# Patient Record
Sex: Male | Born: 1945 | Race: White | Hispanic: No | Marital: Married | Smoking: Never smoker
Health system: Southern US, Community
[De-identification: ages and names within clinical notes are randomized; demographics above are authoritative.]

## PROBLEM LIST (undated history)

## (undated) DIAGNOSIS — I10 Essential (primary) hypertension: Secondary | ICD-10-CM

## (undated) DIAGNOSIS — Z87442 Personal history of urinary calculi: Secondary | ICD-10-CM

## (undated) DIAGNOSIS — N189 Chronic kidney disease, unspecified: Secondary | ICD-10-CM

## (undated) DIAGNOSIS — G4730 Sleep apnea, unspecified: Secondary | ICD-10-CM

## (undated) DIAGNOSIS — M1990 Unspecified osteoarthritis, unspecified site: Secondary | ICD-10-CM

## (undated) DIAGNOSIS — M109 Gout, unspecified: Secondary | ICD-10-CM

## (undated) HISTORY — PX: COLONOSCOPY: SHX174

## (undated) HISTORY — PX: TONSILLECTOMY: SUR1361

## (undated) HISTORY — PX: VASECTOMY: SHX75

## (undated) HISTORY — DX: Unspecified osteoarthritis, unspecified site: M19.90

---

## 2005-07-09 ENCOUNTER — Ambulatory Visit: Payer: Self-pay | Admitting: Otolaryngology

## 2007-09-12 ENCOUNTER — Ambulatory Visit: Payer: Self-pay | Admitting: Gastroenterology

## 2014-08-21 ENCOUNTER — Ambulatory Visit: Payer: Self-pay | Admitting: Emergency Medicine

## 2014-08-21 LAB — CBC WITH DIFFERENTIAL/PLATELET
Basophil #: 0 10*3/uL (ref 0.0–0.1)
Basophil %: 0.3 %
Eosinophil #: 0.1 10*3/uL (ref 0.0–0.7)
Eosinophil %: 1 %
HCT: 43.6 % (ref 40.0–52.0)
HGB: 14.5 g/dL (ref 13.0–18.0)
Lymphocyte #: 1.4 10*3/uL (ref 1.0–3.6)
Lymphocyte %: 22.8 %
MCH: 29.5 pg (ref 26.0–34.0)
MCHC: 33.3 g/dL (ref 32.0–36.0)
MCV: 89 fL (ref 80–100)
Monocyte #: 0.4 x10 3/mm (ref 0.2–1.0)
Monocyte %: 6.4 %
Neutrophil #: 4.4 10*3/uL (ref 1.4–6.5)
Neutrophil %: 69.5 %
Platelet: 175 10*3/uL (ref 150–440)
RBC: 4.92 10*6/uL (ref 4.40–5.90)
RDW: 15.6 % — ABNORMAL HIGH (ref 11.5–14.5)
WBC: 6.3 10*3/uL (ref 3.8–10.6)

## 2014-08-21 LAB — URIC ACID: Uric Acid: 7 mg/dL (ref 3.5–7.2)

## 2014-08-21 LAB — SEDIMENTATION RATE: Erythrocyte Sed Rate: 16 mm/hr (ref 0–20)

## 2015-01-03 ENCOUNTER — Ambulatory Visit
Admission: EM | Admit: 2015-01-03 | Discharge: 2015-01-03 | Disposition: A | Discharge status: Home / Self Care | Payer: 59 | Attending: Internal Medicine | Admitting: Internal Medicine

## 2015-01-03 DIAGNOSIS — Z79899 Other long term (current) drug therapy: Secondary | ICD-10-CM | POA: Insufficient documentation

## 2015-01-03 DIAGNOSIS — M62830 Muscle spasm of back: Secondary | ICD-10-CM | POA: Insufficient documentation

## 2015-01-03 DIAGNOSIS — M549 Dorsalgia, unspecified: Secondary | ICD-10-CM | POA: Diagnosis present

## 2015-01-03 DIAGNOSIS — I10 Essential (primary) hypertension: Secondary | ICD-10-CM | POA: Diagnosis not present

## 2015-01-03 HISTORY — DX: Essential (primary) hypertension: I10

## 2015-01-03 LAB — URINALYSIS COMPLETE WITH MICROSCOPIC (ARMC ONLY)
Bilirubin Urine: NEGATIVE
GLUCOSE, UA: NEGATIVE mg/dL
HGB URINE DIPSTICK: NEGATIVE
Ketones, ur: NEGATIVE mg/dL
Leukocytes, UA: NEGATIVE
Nitrite: NEGATIVE
Protein, ur: NEGATIVE mg/dL
RBC / HPF: NONE SEEN RBC/hpf (ref <3)
SPECIFIC GRAVITY, URINE: 1.015 (ref 1.005–1.030)
Squamous Epithelial / LPF: NONE SEEN — AB
pH: 6 (ref 5.0–8.0)

## 2015-01-03 MED ORDER — DIAZEPAM 2 MG PO TABS: 2.0000 mg | ORAL_TABLET | Freq: Every evening | ORAL | Status: DC | PRN

## 2015-01-03 NOTE — Discharge Instructions (Signed)

## 2015-01-03 NOTE — ED Notes (Signed)
Pt complaining of pain in lower back/ right flank area that started this morning. Denies injury. Pt states "It feels more like kidney pain. It hasn't responded to anything I've taken." Took naproxen at home this morning.

## 2015-01-03 NOTE — ED Provider Notes (Signed)
CSN: [id number]     Arrival date & time 01/03/15  1226 History   First MD Initiated Contact with Patient 01/03/15 1330     Chief Complaint  Patient presents with  . Back Pain   (Consider location/radiation/quality/duration/timing/severity/associated sxs/prior Treatment) HPI   69 year old gentleman who presents with a sudden onset of nonradiating right-sided low back pain. He does not remember any injury. States that if he does have use responds very well to Naprosyn and back exercises at this is persisted despite his usual therapy. 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 Is concerned it may be a kidney he does have one episode of kidney stones in college that he passed spontaneously but has not recurred. He denies any urinary symptoms abdominal pain nausea vomiting diarrhea. He states that he notices certainly more with movement but feels it all the time to a degree.  Past Medical History  Diagnosis Date  . Hypertension    Past Surgical History  Procedure Laterality Date  . Tonsillectomy     Family History  Problem Relation Age of Onset  . Stroke Mother   . Hypertension Father   . Stroke Father    History  Substance Use Topics  . Smoking status: Never Smoker   . Smokeless tobacco: Not on file  . Alcohol Use: Yes     Comment: socially    Review of Systems  Musculoskeletal: Positive for back pain.  All other systems reviewed and are negative.   Allergies  Review of patient's allergies indicates no known allergies.  Home Medications   Prior to Admission medications   Medication Sig Start Date End Date Taking? Authorizing Provider  olmesartan-hydrochlorothiazide (BENICAR HCT) 20-12.5 MG per tablet Take 1 tablet by mouth daily.   Yes Historical Provider, MD  diazepam (VALIUM) 2 MG tablet Take 1 tablet (2 mg total) by mouth at bedtime as needed for anxiety. 01/03/15   Chrissie Noa Roemer, PA-C   BP 164/81 mmHg  Pulse 57  Temp(Src) 97.7 F (36.5 C) (Oral)   Resp 18  Ht 5\' 8"  (1.727 m)  Wt 202 lb (91.627 kg)  BMI 30.72 kg/m2  SpO2 97% Physical Exam  ED Course  Procedures (including critical care time) Labs Review Labs Reviewed  URINALYSIS COMPLETEWITH MICROSCOPIC ([facility] ONLY) - Abnormal; Notable for the following:    Squamous Epithelial / LPF NONE SEEN (*)    All other components within normal limits    Imaging Review No results found.   MDM   1. Lumbar paraspinal muscle spasm    New Prescriptions   DIAZEPAM (VALIUM) 2 MG TABLET    Take 1 tablet (2 mg total) by mouth at bedtime as needed for anxiety.   Plan: 1. Test/x-ray results and diagnosis reviewed with patient 2. rx as per orders; risks, benefits, potential side effects reviewed with patient 3. Recommend supportive treatment with Increased naprosyn,rest,symptom avoidance.exercises for bask and core 4. F/u prn if symptoms worsen or don't improve    Lutricia Feil, PA-C 01/03/15 1417

## 2015-01-16 ENCOUNTER — Ambulatory Visit
Admission: EM | Admit: 2015-01-16 | Discharge: 2015-01-16 | Disposition: A | Discharge status: Home / Self Care | Payer: 59 | Attending: Family Medicine | Admitting: Family Medicine

## 2015-01-16 ENCOUNTER — Encounter: Payer: Self-pay | Admitting: Emergency Medicine

## 2015-01-16 DIAGNOSIS — B349 Viral infection, unspecified: Secondary | ICD-10-CM | POA: Diagnosis not present

## 2015-01-16 DIAGNOSIS — J029 Acute pharyngitis, unspecified: Secondary | ICD-10-CM

## 2015-01-16 DIAGNOSIS — I10 Essential (primary) hypertension: Secondary | ICD-10-CM | POA: Insufficient documentation

## 2015-01-16 DIAGNOSIS — Z79899 Other long term (current) drug therapy: Secondary | ICD-10-CM | POA: Insufficient documentation

## 2015-01-16 LAB — RAPID STREP SCREEN (MED CTR MEBANE ONLY): Streptococcus, Group A Screen (Direct): NEGATIVE

## 2015-01-16 MED ORDER — FLUTICASONE PROPIONATE 50 MCG/ACT NA SUSP: 2.0000 | Freq: Every day | NASAL | Status: DC

## 2015-01-16 NOTE — ED Provider Notes (Signed)
CSN: [id number]     Arrival date & time 01/16/15  1156 History   First MD Initiated Contact with Patient 01/16/15 1258     Chief Complaint  Patient presents with  . Sore Throat   (Consider location/radiation/quality/duration/timing/severity/associated sxs/prior Treatment) HPI is a 69 year old gentleman who presents with a sore throat that began yesterday morning. States he's been using saltwater gargles and lozenges without much success. His throat feels puffy and is been having postnasal drip symptoms. He's had a slight cough with a ivory colored sputum. He feels achy but denies any fever or chills.    Past Medical History  Diagnosis Date  . Hypertension    Past Surgical History  Procedure Laterality Date  . Tonsillectomy    . Vasectomy     Family History  Problem Relation Age of Onset  . Stroke Mother   . Hypertension Father   . Stroke Father    History  Substance Use Topics  . Smoking status: Never Smoker   . Smokeless tobacco: Never Used  . Alcohol Use: Yes     Comment: socially    Review of Systems  HENT: Positive for postnasal drip, rhinorrhea and sore throat.   Respiratory: Positive for cough.     Allergies  Review of patient's allergies indicates no known allergies.  Home Medications   Prior to Admission medications   Medication Sig Start Date End Date Taking? Authorizing Provider  diazepam (VALIUM) 2 MG tablet Take 1 tablet (2 mg total) by mouth at bedtime as needed for anxiety. 01/03/15   Lutricia FeilWilliam P Juergen Hardenbrook, PA-C  fluticasone (FLONASE) 50 MCG/ACT nasal spray Place 2 sprays into both nostrils daily. 01/16/15   Lutricia FeilWilliam P Earle Burson, PA-C  olmesartan-hydrochlorothiazide (BENICAR HCT) 20-12.5 MG per tablet Take 1 tablet by mouth daily.    Historical Provider, MD   BP 160/88 mmHg  Pulse 59  Temp(Src) 97.8 F (36.6 C) (Oral)  Resp 16  Ht 5\' 8"  (1.727 m)  Wt 202 lb (91.627 kg)  BMI 30.72 kg/m2  SpO2 100% Physical Exam  Constitutional: He is oriented to person,  place, and time. He appears well-developed and well-nourished.  HENT:  Head: Normocephalic and atraumatic.  Examination the ears shows dullness the TMs bilaterally. Oropharynx shows evidence of postnasal drip. There is some injection of the tonsillar pillars but no exudate present. There is no anterior cervical adenopathy present. There is some tenderness to percussion of the maxillary sinuses.  Eyes: EOM are normal. Pupils are equal, round, and reactive to light.  Neck: Neck supple.  Pulmonary/Chest: Effort normal and breath sounds normal. No respiratory distress. He has no wheezes. He has no rales. He exhibits no tenderness.  Musculoskeletal: Normal range of motion.  Lymphadenopathy:    He has no cervical adenopathy.  Neurological: He is alert and oriented to person, place, and time. He has normal reflexes.  Skin: Skin is warm and dry.  Psychiatric: He has a normal mood and affect. His behavior is normal. Judgment and thought content normal.    ED Course  Procedures (including critical care time) Labs Review Labs Reviewed  RAPID STREP SCREEN ([facility] AT [facility])  CULTURE, GROUP A STREP ([facility] ONLY)    Imaging Review No results found.   MDM   1. Pharyngitis with viral syndrome    New Prescriptions   FLUTICASONE (FLONASE) 50 MCG/ACT NASAL SPRAY    Place 2 sprays into both nostrils daily.   Plan: 1. Test/x-ray results and diagnosis reviewed with patient 2. rx as per  orders; risks, benefits, potential side effects reviewed with patient 3. Recommend supportive treatment with salt water gargle,lozenges, fluids,rest 4. F/u prn if symptoms worsen or don't improve    Lutricia Feil, PA-C 01/16/15 1340

## 2015-01-16 NOTE — Discharge Instructions (Signed)

## 2015-01-16 NOTE — ED Notes (Signed)
Patient c/o sore throat and post nasal drainage since yesterday.  Patient denies fevers.

## 2015-01-19 LAB — CULTURE, GROUP A STREP (THRC)

## 2015-12-09 ENCOUNTER — Ambulatory Visit
Admission: EM | Admit: 2015-12-09 | Discharge: 2015-12-09 | Disposition: A | Discharge status: Home / Self Care | Payer: 59 | Attending: Family Medicine | Admitting: Family Medicine

## 2015-12-09 ENCOUNTER — Encounter: Payer: Self-pay | Admitting: *Deleted

## 2015-12-09 DIAGNOSIS — H6593 Unspecified nonsuppurative otitis media, bilateral: Secondary | ICD-10-CM

## 2015-12-09 DIAGNOSIS — J301 Allergic rhinitis due to pollen: Secondary | ICD-10-CM | POA: Diagnosis not present

## 2015-12-09 DIAGNOSIS — H6092 Unspecified otitis externa, left ear: Secondary | ICD-10-CM

## 2015-12-09 MED ORDER — ACETAMINOPHEN 500 MG PO TABS: 1000.0000 mg | ORAL_TABLET | Freq: Four times a day (QID) | ORAL | Status: AC | PRN

## 2015-12-09 MED ORDER — CETIRIZINE HCL 10 MG PO CHEW: 10.0000 mg | CHEWABLE_TABLET | Freq: Every day | ORAL | Status: DC

## 2015-12-09 MED ORDER — AMOXICILLIN-POT CLAVULANATE 875-125 MG PO TABS: 1.0000 | ORAL_TABLET | Freq: Two times a day (BID) | ORAL | Status: DC

## 2015-12-09 MED ORDER — SALINE SPRAY 0.65 % NA SOLN: 2.0000 | NASAL | Status: DC

## 2015-12-09 MED ORDER — CIPROFLOXACIN-HYDROCORTISONE 0.2-1 % OT SUSP: 4.0000 [drp] | Freq: Two times a day (BID) | OTIC | Status: AC

## 2015-12-09 NOTE — ED Notes (Signed)
Left ear pain with drainage, onset Friday. Denies fever.

## 2015-12-09 NOTE — ED Provider Notes (Signed)
CSN: [id number]     Arrival date & time 12/09/15  1027 History   First MD Initiated Contact with Patient 12/09/15 1055     Chief Complaint  Patient presents with  . Otalgia   (Consider location/radiation/quality/duration/timing/severity/associated sxs/prior Treatment) HPI Comments: Married [place] male here for evaluation of left ear sounding squishy like water in canal, pain.  PMHx external ear infection, seasonal allergies, hypertension  PSHx denied  FHx stroke -father and mother  Patient is a 70 y.o. male presenting with ear pain. The history is provided by the patient.  Otalgia Location:  Left Behind ear:  No abnormality Quality:  Aching, pressure and throbbing Severity:  Moderate Onset quality:  Sudden Duration:  4 days Timing:  Constant Progression:  Worsening Chronicity:  New Context: water   Context: not direct blow, not elevation change, not foreign body in ear and not loud noise   Relieved by:  Nothing Worsened by:  Swallowing and position Ineffective treatments:  Position Associated symptoms: ear discharge   Associated symptoms: no abdominal pain, no congestion, no cough, no diarrhea, no fever, no headaches, no hearing loss, no neck pain, no rash, no rhinorrhea, no sore throat, no tinnitus and no vomiting   Risk factors: no recent travel, no chronic ear infection and no prior ear surgery     Past Medical History  Diagnosis Date  . Hypertension    Past Surgical History  Procedure Laterality Date  . Tonsillectomy    . Vasectomy     Family History  Problem Relation Age of Onset  . Stroke Mother   . Hypertension Father   . Stroke Father    Social History  Substance Use Topics  . Smoking status: Never Smoker   . Smokeless tobacco: Never Used  . Alcohol Use: Yes     Comment: socially    Review of Systems  Constitutional: Negative for fever, chills, diaphoresis, activity change, appetite change, fatigue and unexpected weight change.  HENT: Positive for ear  discharge and ear pain. Negative for congestion, dental problem, drooling, facial swelling, hearing loss, mouth sores, nosebleeds, postnasal drip, rhinorrhea, sinus pressure, sneezing, sore throat, tinnitus, trouble swallowing and voice change.   Eyes: Negative for photophobia, pain, discharge, redness, itching and visual disturbance.  Respiratory: Negative for cough, choking, chest tightness, shortness of breath, wheezing and stridor.   Cardiovascular: Negative for chest pain, palpitations and leg swelling.  Gastrointestinal: Negative for nausea, vomiting, abdominal pain, diarrhea, constipation, blood in stool and abdominal distention.  Endocrine: Negative for cold intolerance and heat intolerance.  Genitourinary: Negative for dysuria.  Musculoskeletal: Negative for myalgias, back pain, joint swelling, arthralgias, gait problem, neck pain and neck stiffness.  Skin: Negative for color change, pallor, rash and wound.  Allergic/Immunologic: Positive for environmental allergies. Negative for food allergies and immunocompromised state.  Neurological: Negative for dizziness, tremors, seizures, syncope, facial asymmetry, speech difficulty, weakness, light-headedness, numbness and headaches.  Hematological: Negative for adenopathy. Does not bruise/bleed easily.  Psychiatric/Behavioral: Negative for behavioral problems, confusion, sleep disturbance and agitation.    Allergies  Review of patient's allergies indicates no known allergies.  Home Medications   Prior to Admission medications   Medication Sig Start Date End Date Taking? Authorizing Provider  colchicine 0.6 MG tablet Take 0.6 mg by mouth daily.   Yes Historical Provider, MD  acetaminophen (TYLENOL) 500 MG tablet Take 2 tablets (1,000 mg total) by mouth every 6 (six) hours as needed for mild pain, moderate pain, fever or headache. 12/09/15 12/12/15  Jarold Songina A  Betancourt, NP  amoxicillin-clavulanate (AUGMENTIN) 875-125 MG tablet Take 1 tablet by  mouth every 12 (twelve) hours. 12/09/15   Barbaraann Barthel, NP  cetirizine (ZYRTEC) 10 MG chewable tablet Chew 1 tablet (10 mg total) by mouth daily. 12/09/15   Barbaraann Barthel, NP  ciprofloxacin-hydrocortisone (CIPRO HC OTIC) otic suspension Place 4 drops into the left ear 2 (two) times daily. 12/09/15 12/15/15  Barbaraann Barthel, NP  olmesartan-hydrochlorothiazide (BENICAR HCT) 20-12.5 MG per tablet Take 1 tablet by mouth daily.    Historical Provider, MD  sodium chloride (OCEAN) 0.65 % SOLN nasal spray Place 2 sprays into both nostrils every 2 (two) hours while awake. 12/09/15   Barbaraann Barthel, NP   Meds Ordered and Administered this Visit  Medications - No data to display  BP 152/78 mmHg  Pulse 58  Temp(Src) 98.5 F (36.9 C) (Oral)  Resp 16  Ht 5\' 8"  (1.727 m)  Wt 203 lb (92.08 kg)  BMI 30.87 kg/m2  SpO2 98% No data found.   Physical Exam  Constitutional: He is oriented to person, place, and time. Vital signs are normal. He appears well-developed and well-nourished. He is active and cooperative.  Non-toxic appearance. He does not have a sickly appearance. He does not appear ill. No distress.  HENT:  Head: Normocephalic and atraumatic.  Right Ear: Hearing, external ear and ear canal normal. A middle ear effusion is present.  Left Ear: Hearing and ear canal normal. There is drainage. A middle ear effusion is present.  Nose: Mucosal edema and rhinorrhea present. No nose lacerations, sinus tenderness, nasal deformity, septal deviation or nasal septal hematoma. No epistaxis.  No foreign bodies. Right sinus exhibits no maxillary sinus tenderness and no frontal sinus tenderness. Left sinus exhibits no maxillary sinus tenderness and no frontal sinus tenderness.  Mouth/Throat: Uvula is midline and mucous membranes are normal. Mucous membranes are not pale, not dry and not cyanotic. He does not have dentures. No oral lesions. No trismus in the jaw. Normal dentition. No dental abscesses, uvula  swelling, lacerations or dental caries. Posterior oropharyngeal edema and posterior oropharyngeal erythema present. No oropharyngeal exudate or tonsillar abscesses.  Cobblestoning posterior pharynx; bilateral TMs with air fluid level; debris wet left external auditory canal white; bilateral nasal turbinates with edema/erythema clear discharge; bilateral allergic shiners  Eyes: Conjunctivae, EOM and lids are normal. Pupils are equal, round, and reactive to light. Right eye exhibits no chemosis, no discharge, no exudate and no hordeolum. No foreign body present in the right eye. Left eye exhibits no chemosis, no discharge, no exudate and no hordeolum. No foreign body present in the left eye. Right conjunctiva is not injected. Right conjunctiva has no hemorrhage. Left conjunctiva is not injected. Left conjunctiva has no hemorrhage. No scleral icterus. Right eye exhibits normal extraocular motion and no nystagmus. Left eye exhibits normal extraocular motion and no nystagmus. Right pupil is round and reactive. Left pupil is round and reactive. Pupils are equal.  Neck: Trachea normal and normal range of motion. Neck supple. No tracheal tenderness, no spinous process tenderness and no muscular tenderness present. No rigidity. No tracheal deviation, no edema, no erythema and normal range of motion present. No thyroid mass and no thyromegaly present.  Cardiovascular: Normal rate, regular rhythm, S1 normal, S2 normal, normal heart sounds and intact distal pulses.  PMI is not displaced.  Exam reveals no gallop and no friction rub.   No murmur heard. Pulmonary/Chest: Effort normal and breath sounds normal. No stridor. No respiratory  distress. He has no decreased breath sounds. He has no wheezes. He has no rhonchi. He has no rales.  Abdominal: Soft. He exhibits no distension.  Musculoskeletal: Normal range of motion. He exhibits no edema or tenderness.       Right shoulder: Normal.       Left shoulder: Normal.        Right elbow: Normal.      Left elbow: Normal.       Right hip: Normal.       Left hip: Normal.       Right knee: Normal.       Left knee: Normal.       Cervical back: Normal.       Right hand: Normal.       Left hand: Normal.  Lymphadenopathy:       Head (right side): No submental, no submandibular, no tonsillar, no preauricular, no posterior auricular and no occipital adenopathy present.       Head (left side): No submental, no submandibular, no tonsillar, no preauricular, no posterior auricular and no occipital adenopathy present.    He has no cervical adenopathy.       Right cervical: No superficial cervical, no deep cervical and no posterior cervical adenopathy present.      Left cervical: No superficial cervical, no deep cervical and no posterior cervical adenopathy present.  Neurological: He is alert and oriented to person, place, and time. He displays no atrophy and no tremor. No cranial nerve deficit or sensory deficit. He exhibits normal muscle tone. He displays no seizure activity. Coordination and gait normal. GCS eye subscore is 4. GCS verbal subscore is 5. GCS motor subscore is 6.  Skin: Skin is warm, dry and intact. No abrasion, no bruising, no burn, no ecchymosis, no laceration, no lesion, no petechiae and no rash noted. He is not diaphoretic. No cyanosis or erythema. No pallor. Nails show no clubbing.  Psychiatric: He has a normal mood and affect. His speech is normal and behavior is normal. Judgment and thought content normal. Cognition and memory are normal.  Nursing note and vitals reviewed.   ED Course  Procedures (including critical care time)  Labs Review Labs Reviewed - No data to display  Imaging Review No results found.  Patient has taken his blood pressure medications 3 hours earlier today.  Having some discomfort discussed with patient elevated blood pressure probably due to pain but continue to monitor at home and if worsening or not improving schedule follow  up with PCM.  Patient verbalized understanding of information/instructions, agreed with plan of care and no further questions at this time.  MDM   1. Otitis externa of left ear   2. Otitis media with effusion, bilateral   3. Allergic rhinitis due to pollen   Treatment as ordered.  Cipro HC 4 gtts left ear BID x 7 days.  Symptomatic therapy suggested fluids, NSAIDs and rest.  May take Tylenol or Motrin for fevers.  Call or return to clinic as needed if these symptoms worsen or fail to improve as anticipated.   I do not see where any further testing or imaging is necessary at this time.   I will suggest supportive care, rest, good hygiene and encourage the patient to take adequate fluids.  The patient is to return to clinic or EMERGENCY ROOM if symptoms worsen or change significantly e.g. ear pain, fever, purulent discharge from ears or bleeding.  Exitcare handout on otitis externa given to patient.  Patient verbalized agreement and understanding of treatment plan and had no further questions at this time.    Patient may use normal saline nasal spray as needed.  Restart zyrtec 10mg  po daily Consider nasal steroid use.  Avoid triggers if possible.  Shower prior to bedtime if exposed to triggers.  If allergic dust/dust mites recommend mattress/pillow covers/encasements; washing linens, vacuuming, sweeping, dusting weekly.  Call or return to clinic as needed if these symptoms worsen or fail to improve as anticipated.   Exitcare handout on allergic rhinitis given to patient.  Patient verbalized understanding of instructions, agreed with plan of care and had no further questions at this time.  P2:  Avoidance and hand washing.  Supportive treatment.   No evidence of invasive bacterial infection, non toxic and well hydrated.  This is most likely self limiting viral infection.  I do not see where any further testing or imaging is necessary at this time.   I will suggest supportive care, rest, good hygiene and  encourage the patient to take adequate fluids.  The patient is to return to clinic or EMERGENCY ROOM if symptoms worsen or change significantly e.g. ear pain, fever, purulent discharge from ears or bleeding.  If worsening pain/fever start augmentin 875mg  po BID x 10 days for otitis media.  Exitcare handout on otitis media with effusion given to patient.  Patient verbalized agreement and understanding of treatment plan.      Barbaraann Barthel, NP 12/09/15 2003

## 2015-12-09 NOTE — Discharge Instructions (Signed)
Allergic Rhinitis Allergic rhinitis is when the mucous membranes in the nose respond to allergens. Allergens are particles in the air that cause your body to have an allergic reaction. This causes you to release allergic antibodies. Through a chain of events, these eventually cause you to release histamine into the blood stream. Although meant to protect the body, it is this release of histamine that causes your discomfort, such as frequent sneezing, congestion, and an itchy, runny nose.  CAUSES Seasonal allergic rhinitis (hay fever) is caused by pollen allergens that may come from grasses, trees, and weeds. Year-round allergic rhinitis (perennial allergic rhinitis) is caused by allergens such as house dust mites, pet dander, and mold spores. SYMPTOMS  Nasal stuffiness (congestion).  Itchy, runny nose with sneezing and tearing of the eyes. DIAGNOSIS Your health care provider can help you determine the allergen or allergens that trigger your symptoms. If you and your health care provider are unable to determine the allergen, skin or blood testing may be used. Your health care provider will diagnose your condition after taking your health history and performing a physical exam. Your health care provider may assess you for other related conditions, such as asthma, pink eye, or an ear infection. TREATMENT Allergic rhinitis does not have a cure, but it can be controlled by:  Medicines that block allergy symptoms. These may include allergy shots, nasal sprays, and oral antihistamines.  Avoiding the allergen. Hay fever may often be treated with antihistamines in pill or nasal spray forms. Antihistamines block the effects of histamine. There are over-the-counter medicines that may help with nasal congestion and swelling around the eyes. Check with your health care provider before taking or giving this medicine. If avoiding the allergen or the medicine prescribed do not work, there are many new medicines  your health care provider can prescribe. Stronger medicine may be used if initial measures are ineffective. Desensitizing injections can be used if medicine and avoidance does not work. Desensitization is when a patient is given ongoing shots until the body becomes less sensitive to the allergen. Make sure you follow up with your health care provider if problems continue. HOME CARE INSTRUCTIONS It is not possible to completely avoid allergens, but you can reduce your symptoms by taking steps to limit your exposure to them. It helps to know exactly what you are allergic to so that you can avoid your specific triggers. SEEK MEDICAL CARE IF:  You have a fever.  You develop a cough that does not stop easily (persistent).  You have shortness of breath.  You start wheezing.  Symptoms interfere with normal daily activities.   This information is not intended to replace advice given to you by your health care provider. Make sure you discuss any questions you have with your health care provider.   Document Released: 03/31/2001 Document Revised: 07/27/2014 Document Reviewed: 03/13/2013 Elsevier Interactive Patient Education 2016 [place]. Otitis Media With Effusion Otitis media with effusion is the presence of fluid in the middle ear. This is a common problem in children, which often follows ear infections. It may be present for weeks or longer after the infection. Unlike an acute ear infection, otitis media with effusion refers only to fluid behind the ear drum and not infection. Children with repeated ear and sinus infections and allergy problems are the most likely to get otitis media with effusion. CAUSES  The most frequent cause of the fluid buildup is dysfunction of the eustachian tubes. These are the tubes that drain fluid  in the ears to the back of the nose (nasopharynx). SYMPTOMS   The main symptom of this condition is hearing loss. As a result, you or your child may:  Listen to the TV  at a loud volume.  Not respond to questions.  Ask "what" often when spoken to.  Mistake or confuse one sound or word for another.  There may be a sensation of fullness or pressure but usually not pain. DIAGNOSIS   Your health care provider will diagnose this condition by examining you or your child's ears.  Your health care provider may test the pressure in you or your child's ear with a tympanometer.  A hearing test may be conducted if the problem persists. TREATMENT   Treatment depends on the duration and the effects of the effusion.  Antibiotics, decongestants, nose drops, and cortisone-type drugs (tablets or nasal spray) may not be helpful.  Children with persistent ear effusions may have delayed language or behavioral problems. Children at risk for developmental delays in hearing, learning, and speech may require referral to a specialist earlier than children not at risk.  You or your child's health care provider may suggest a referral to an ear, nose, and throat surgeon for treatment. The following may help restore normal hearing:  Drainage of fluid.  Placement of ear tubes (tympanostomy tubes).  Removal of adenoids (adenoidectomy). HOME CARE INSTRUCTIONS   Avoid secondhand smoke.  Infants who are breastfed are less likely to have this condition.  Avoid feeding infants while they are lying flat.  Avoid known environmental allergens.  Avoid people who are sick. SEEK MEDICAL CARE IF:   Hearing is not better in 3 months.  Hearing is worse.  Ear pain.  Drainage from the ear.  Dizziness. MAKE SURE YOU:   Understand these instructions.  Will watch your condition.  Will get help right away if you are not doing well or get worse.   This information is not intended to replace advice given to you by your health care provider. Make sure you discuss any questions you have with your health care provider.   Document Released: 08/13/2004 Document Revised:  07/27/2014 Document Reviewed: 01/31/2013 Elsevier Interactive Patient Education 2016 [employer] Otitis Externa Otitis externa is a bacterial or fungal infection of the outer ear canal. This is the area from the eardrum to the outside of the ear. Otitis externa is sometimes called "swimmer's ear." CAUSES  Possible causes of infection include:  Swimming in dirty water.  Moisture remaining in the ear after swimming or bathing.  Mild injury (trauma) to the ear.  Objects stuck in the ear (foreign body).  Cuts or scrapes (abrasions) on the outside of the ear. SIGNS AND SYMPTOMS  The first symptom of infection is often itching in the ear canal. Later signs and symptoms may include swelling and redness of the ear canal, ear pain, and yellowish-white fluid (pus) coming from the ear. The ear pain may be worse when pulling on the earlobe. DIAGNOSIS  Your health care provider will perform a physical exam. A sample of fluid may be taken from the ear and examined for bacteria or fungi. TREATMENT  Antibiotic ear drops are often given for 10 to 14 days. Treatment may also include pain medicine or corticosteroids to reduce itching and swelling. HOME CARE INSTRUCTIONS   Apply antibiotic ear drops to the ear canal as prescribed by your health care provider.  Take medicines only as directed by your health care provider.  If you have diabetes,  follow any additional treatment instructions from your health care provider.  Keep all follow-up visits as directed by your health care provider. PREVENTION   Keep your ear dry. Use the corner of a towel to absorb water out of the ear canal after swimming or bathing.  Avoid scratching or putting objects inside your ear. This can damage the ear canal or remove the protective wax that lines the canal. This makes it easier for bacteria and fungi to grow.  Avoid swimming in lakes, polluted water, or poorly chlorinated pools.  You may use ear drops made of  rubbing alcohol and vinegar after swimming. Combine equal parts of white vinegar and alcohol in a bottle. Put 3 or 4 drops into each ear after swimming. SEEK MEDICAL CARE IF:   You have a fever.  Your ear is still red, swollen, painful, or draining pus after 3 days.  Your redness, swelling, or pain gets worse.  You have a severe headache.  You have redness, swelling, pain, or tenderness in the area behind your ear. MAKE SURE YOU:   Understand these instructions.  Will watch your condition.  Will get help right away if you are not doing well or get worse.   This information is not intended to replace advice given to you by your health care provider. Make sure you discuss any questions you have with your health care provider.   Document Released: 07/06/2005 Document Revised: 07/27/2014 Document Reviewed: 07/23/2011 Elsevier Interactive Patient Education [employer]

## 2016-02-21 ENCOUNTER — Ambulatory Visit (INDEPENDENT_AMBULATORY_CARE_PROVIDER_SITE_OTHER): Payer: 59

## 2016-02-21 ENCOUNTER — Encounter: Payer: Self-pay | Admitting: *Deleted

## 2016-02-21 ENCOUNTER — Ambulatory Visit
Admission: EM | Admit: 2016-02-21 | Discharge: 2016-02-21 | Disposition: A | Discharge status: Home / Self Care | Payer: 59 | Attending: Family Medicine | Admitting: Family Medicine

## 2016-02-21 DIAGNOSIS — M19141 Post-traumatic osteoarthritis, right hand: Secondary | ICD-10-CM

## 2016-02-21 DIAGNOSIS — S63619A Unspecified sprain of unspecified finger, initial encounter: Secondary | ICD-10-CM

## 2016-02-21 HISTORY — DX: Gout, unspecified: M10.9

## 2016-02-21 MED ORDER — NAPROXEN 500 MG PO TABS: 500.0000 mg | ORAL_TABLET | Freq: Two times a day (BID) | ORAL | 0 refills | Status: DC

## 2016-02-21 NOTE — ED Provider Notes (Signed)
CSN: [id number]     Arrival date & time 02/21/16  1153 History   None    Chief Complaint  Patient presents with  . Hand Pain   (Consider location/radiation/quality/duration/timing/severity/associated sxs/prior Treatment) HPI  This a 70 year old male who presents with right nondominant hand pain. He states that last week he was having a pain in his second third and fourth fingers P IP and MP joints. He took 3 days of colchicine which did absolutely nothing. He states that over time it improved but today he was flipping a bale of hay when his finger became entwined in the draining and he reinjured his hand most of his pain today is on the third MP joint. He has swelling in this area extending proximally into the metacarpal with some ecchymosis present this is mostly dorsal but he does feel the pain volarly.  Past Medical History:  Diagnosis Date  . Gout   . Hypertension    Past Surgical History:  Procedure Laterality Date  . TONSILLECTOMY    . VASECTOMY     Family History  Problem Relation Age of Onset  . Stroke Mother   . Hypertension Father   . Stroke Father    Social History  Substance Use Topics  . Smoking status: Never Smoker  . Smokeless tobacco: Never Used  . Alcohol use Yes     Comment: socially    Review of Systems  Constitutional: Positive for activity change. Negative for chills, fatigue and fever.  Musculoskeletal: Positive for arthralgias, joint swelling and myalgias.  Skin: Positive for color change.  All other systems reviewed and are negative.   Allergies  Review of patient's allergies indicates no known allergies.  Home Medications   Prior to Admission medications   Medication Sig Start Date End Date Taking? Authorizing Provider  colchicine 0.6 MG tablet Take 0.6 mg by mouth daily.   Yes Historical Provider, MD  olmesartan-hydrochlorothiazide (BENICAR HCT) 20-12.5 MG per tablet Take 1 tablet by mouth daily.   Yes Historical Provider, MD   amoxicillin-clavulanate (AUGMENTIN) 875-125 MG tablet Take 1 tablet by mouth every 12 (twelve) hours. 12/09/15   Barbaraann Barthel, NP  cetirizine (ZYRTEC) 10 MG chewable tablet Chew 1 tablet (10 mg total) by mouth daily. 12/09/15   Barbaraann Barthel, NP  naproxen (NAPROSYN) 500 MG tablet Take 1 tablet (500 mg total) by mouth 2 (two) times daily. 02/21/16   Lutricia Feil, PA-C  sodium chloride (OCEAN) 0.65 % SOLN nasal spray Place 2 sprays into both nostrils every 2 (two) hours while awake. 12/09/15   Barbaraann Barthel, NP   Meds Ordered and Administered this Visit  Medications - No data to display  BP (!) 179/89 (BP Location: Left Arm)   Pulse (!) 54   Temp 97.7 F (36.5 C)   Resp 16   Ht  (1.727 m)   Wt 200 lb (90.7 kg)   SpO2 98%   BMI 30.41 kg/m  No data found.   Physical Exam  Constitutional: He is oriented to person, place, and time. He appears well-developed and well-nourished. No distress.  HENT:  Head: Normocephalic and atraumatic.  Eyes: EOM are normal. Pupils are equal, round, and reactive to light.  Neck: Normal range of motion. Neck supple.  Musculoskeletal: Normal range of motion. He exhibits edema, tenderness and deformity.  Neurological: He is alert and oriented to person, place, and time.  Skin: Skin is warm and dry. He is not diaphoretic. There is erythema.  Psychiatric: He has a normal mood and affect. His behavior is normal. Judgment and thought content normal.  Nursing note and vitals reviewed.   Urgent Care Course   Clinical Course    Procedures (including critical care time)  Labs Review Labs Reviewed - No data to display  Imaging Review Dg Hand Complete Right  Result Date: 02/21/2016 CLINICAL DATA:  Right hand pain. EXAM: RIGHT HAND - COMPLETE 3+ VIEW COMPARISON:  None. FINDINGS: There is a radiopaque foreign bodies identified within the soft tissues adjacent to the base of the fifth metacarpal. This measures approximately 5 mm. There is mild  joint space narrowing and marginal spur formation involving the DIP joints and the second and third MCP joints. No acute fracture or subluxation identified. IMPRESSION: 1. No acute bone abnormality. 2. Age-indeterminate a radiopaque foreign body is identified within the soft tissues adjacent to the base of the fifth metacarpal bone. 3. Mild degenerative changes are noted involving the DIP joints and second and third MCP joints. Electronically Signed   By: Signa Kell M.D.   On: 02/21/2016 12:36     Visual Acuity Review  Right Eye Distance:   Left Eye Distance:   Bilateral Distance:    Right Eye Near:   Left Eye Near:    Bilateral Near:     Dorsal splint was applied over the third finger right hand  MDM   1. Finger sprain, initial encounter   2. Post-traumatic osteoarthritis of right hand    New Prescriptions   NAPROXEN (NAPROSYN) 500 MG TABLET    Take 1 tablet (500 mg total) by mouth 2 (two) times daily.  Plan: 1. Test/x-ray results and diagnosis reviewed with patient 2. rx as per orders; risks, benefits, potential side effects reviewed with patient 3. Recommend supportive treatment with Rest and heat therapy as necessary. Follow-up with primary care physician is not improving. Use a dorsal splint as necessary for comfort and protection 4. F/u prn if symptoms worsen or don't improve     Lutricia Feil, PA-C 02/21/16 [address] Phillis Knack, [place] 02/21/16 2051

## 2016-02-21 NOTE — ED Triage Notes (Signed)
Patient started having pain on his right posterior hand 1 week ago. Patient is not sure how he injured his hand. No previous history of right hand injury or surgery. Patient does report having gout in the past, but in his feet.

## 2016-08-18 ENCOUNTER — Telehealth: Payer: Self-pay

## 2016-08-18 NOTE — Telephone Encounter (Signed)
Patient has some questions about his feeding tube. Please call patient to advice.

## 2016-08-18 NOTE — Telephone Encounter (Signed)
This not our patient. There is no indication he has a feeding tube. Not sure who he is seeing. Not GI.

## 2019-01-20 ENCOUNTER — Ambulatory Visit
Admission: RE | Admit: 2019-01-20 | Discharge: 2019-01-20 | Disposition: A | Discharge status: Home / Self Care | Payer: Medicare Other | Source: Ambulatory Visit | Attending: Family Medicine | Admitting: Family Medicine

## 2019-01-20 ENCOUNTER — Other Ambulatory Visit: Payer: Self-pay | Admitting: Family Medicine

## 2019-01-20 ENCOUNTER — Other Ambulatory Visit: Payer: Self-pay

## 2019-01-20 ENCOUNTER — Ambulatory Visit
Admission: RE | Admit: 2019-01-20 | Discharge: 2019-01-20 | Disposition: A | Discharge status: Home / Self Care | Payer: Medicare Other | Attending: Family Medicine | Admitting: Family Medicine

## 2019-01-20 DIAGNOSIS — M5442 Lumbago with sciatica, left side: Secondary | ICD-10-CM | POA: Insufficient documentation

## 2019-01-20 DIAGNOSIS — M5441 Lumbago with sciatica, right side: Secondary | ICD-10-CM | POA: Diagnosis present

## 2019-01-31 ENCOUNTER — Encounter: Payer: Self-pay | Admitting: Family Medicine

## 2019-02-07 ENCOUNTER — Ambulatory Visit: Payer: Medicare Other | Admitting: Family Medicine

## 2019-02-07 ENCOUNTER — Encounter: Payer: Self-pay | Admitting: Family Medicine

## 2019-02-07 ENCOUNTER — Other Ambulatory Visit: Payer: Self-pay

## 2019-02-07 DIAGNOSIS — M48061 Spinal stenosis, lumbar region without neurogenic claudication: Secondary | ICD-10-CM | POA: Diagnosis not present

## 2019-02-07 MED ORDER — GABAPENTIN 100 MG PO CAPS: 200.0000 mg | ORAL_CAPSULE | Freq: Every day | ORAL | 0 refills | Status: DC

## 2019-02-07 NOTE — Patient Instructions (Addendum)
Good to see you.  Ice 20 minutes 2 times daily. Usually after activity and before bed. Exercises 3 times a week.  Gabapentin 200 mg at night  Iron 65 mg with 500 mg of Vitamin C for absorption Tart cherry extract 1200mg  at night See me again in 3-5 weeks

## 2019-02-07 NOTE — Assessment & Plan Note (Signed)
Degenerative spinal stenosis.  I am concerned for this.  Started on gabapentin, home exercises and work with Product/process development scientist.  Patient's laboratory work-up in the past has shown a potential for an anemia likely iron deficiency.  Patient will be treated for this as well.  Could be the cause of some of his cramping that he has been having.  Discussed posture and ergonomics.  Follow-up with me again 4 to 8 weeks

## 2019-02-07 NOTE — Progress Notes (Signed)
[name] [address] [place], [place] [address] Phone: [phone] Subjective:   Nicholas Arellano, am serving as a scribe for Dr. Hulan Saas.   CC: Low back pain  BJY:[id number]  Nicholas Arellano is a 73 y.o. male coming in with complaint of back pain. Xray taken on 01/20/2019. Patient states that he has been getting cramps in hamstrings and calves. Tingling in calf on left leg. Has been occurring for one month. Cramping occurs with reaching overhead and with rotation. Has been seeing a chiropractor but treatments have not resolved symptoms. Movement seems to decrease his pain ie. Walking specifically. Took naproxen last night before bed. Does have hard time sleeping due to pain in right hip.    Patient had x-rays taken January 20, 2019.  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   Past Medical History:  Diagnosis Date  . Gout   . Hypertension    Past Surgical History:  Procedure Laterality Date  . TONSILLECTOMY    . VASECTOMY     Social History   Socioeconomic History  . Marital status: Married    Spouse name: Not on file  . Number of children: Not on file  . Years of education: Not on file  . Highest education level: Not on file  Occupational History  . Not on file  Social Needs  . Financial resource strain: Not on file  . Food insecurity    Worry: Not on file    Inability: Not on file  . Transportation needs    Medical: Not on file    Non-medical: Not on file  Tobacco Use  . Smoking status: Never Smoker  . Smokeless tobacco: Never Used  Substance and Sexual Activity  . Alcohol use: Yes    Comment: socially  . Drug use: Arellano  . Sexual activity: Not on file  Lifestyle  . Physical activity    Days per week: Not on file    Minutes per session: Not on file  . Stress: Not on file  Relationships  . Social Herbalist on phone: Not on file    Gets  together: Not on file    Attends religious service: Not on file    Active member of club or organization: Not on file    Attends meetings of clubs or organizations: Not on file    Relationship status: Not on file  Other Topics Concern  . Not on file  Social History Narrative  . Not on file   Arellano Known Allergies Family History  Problem Relation Age of Onset  . Stroke Mother   . Hypertension Father   . Stroke Father      Current Outpatient Medications (Cardiovascular):  .  olmesartan-hydrochlorothiazide (BENICAR HCT) 20-12.5 MG per tablet, Take 1 tablet by mouth daily.  Current Outpatient Medications (Respiratory):  .  cetirizine (ZYRTEC) 10 MG chewable tablet, Chew 1 tablet (10 mg total) by mouth daily. .  sodium chloride (OCEAN) 0.65 % SOLN nasal spray, Place 2 sprays into both nostrils every 2 (two) hours while awake.  Current Outpatient Medications (Analgesics):  .  colchicine 0.6 MG tablet, Take 0.6 mg by mouth daily. .  naproxen (NAPROSYN) 500 MG tablet, Take 1 tablet (500 mg total) by mouth 2 (two) times daily.   Current Outpatient Medications (Other):  .  amoxicillin-clavulanate (AUGMENTIN) 875-125 MG tablet, Take 1 tablet by mouth every 12 (  twelve) hours. .  gabapentin (NEURONTIN) 100 MG capsule, Take 2 capsules (200 mg total) by mouth at bedtime.    Past medical history, social, surgical and family history all reviewed in electronic medical record.  Arellano pertanent information unless stated regarding to the chief complaint.   Review of Systems:  Arellano headache, visual changes, nausea, vomiting, diarrhea, constipation, dizziness, abdominal pain, skin rash, fevers, chills, night sweats, weight loss, swollen lymph nodes, body aches, joint swelling,chest pain, shortness of breath, mood changes.  Positive muscle aches  Objective  Blood pressure 128/72, pulse 73, height 5\' 8"  (1.727 m), weight 205 lb (93 kg), SpO2 97 %.    General: Arellano apparent distress alert and oriented x3  mood and affect normal, dressed appropriately.  HEENT: Pupils equal, extraocular movements intact  Respiratory: Patient's speak in full sentences and does not appear short of breath  Cardiovascular: Arellano lower extremity edema, non tender, Arellano erythema  Skin: Warm dry intact with Arellano signs of infection or rash on extremities or on axial skeleton.  Abdomen: Soft nontender  Neuro: Cranial nerves II through XII are intact, neurovascularly intact in all extremities with 2+ DTRs and 2+ pulses.  Lymph: Arellano lymphadenopathy of posterior or anterior cervical chain or axillae bilaterally.  Gait normal with good balance and coordination.  MSK:  Non tender with full range of motion and good stability and symmetric strength and tone of shoulders, elbows, wrist, hip, knee and ankles bilaterally.  Back Exam:  Inspection: Loss of lordosis Motion: Flexion 35 deg, Extension 15 deg, Side Bending to 35 deg bilaterally,  Rotation to 45 deg bilaterally  SLR laying: Negative  XSLR laying: Negative  Palpable tenderness: Tender to palpation paraspinal musculature lumbar spine right greater than left. FABER: negative.  Tightness bilaterally severe tightness of the hamstrings bilaterally as well Sensory change: Gross sensation intact to all lumbar and sacral dermatomes.  Reflexes: 2+ at both patellar tendons, 2+ at achilles tendons, Babinski's downgoing.  Strength at foot  Plantar-flexion: 5/5 Dorsi-flexion: 5/5 Eversion: 5/5 Inversion: 5/5  Leg strength  Quad: 5/5 Hamstring: 5/5 Hip flexor: 5/5 Hip abductors: 5/5  Gait unremarkable.   [id number]; 15 additional minutes spent for Therapeutic exercises as stated in above notes.  This included exercises focusing on stretching, strengthening, with significant focus on eccentric aspects.   Long term goals include an improvement in range of motion, strength, endurance as well as avoiding reinjury. Patient's frequency would include in 1-2 times a day, 3-5 times a week for a duration  of 6-12 weeks.  Low back exercises that included:  Pelvic tilt/bracing instruction to focus on control of the pelvic girdle and lower abdominal muscles  Glute strengthening exercises, focusing on proper firing of the glutes without engaging the low back muscles Proper stretching techniques for maximum relief for the hamstrings, hip flexors, low back and some rotation where tolerated  Proper technique shown and discussed handout in great detail with ATC.  All questions were discussed and answered.     Impression and Recommendations:     This case required medical decision making of moderate complexity. The above documentation has been reviewed and is accurate and complete Judi SaaZachary M Smith, DO       Note: This dictation was prepared with Dragon dictation along with smaller phrase technology. Any transcriptional errors that result from this process are unintentional.

## 2019-03-03 NOTE — Progress Notes (Signed)
[name] Dontrel Smethers D.O. [facility] [address] [place], [place] [address] Phone: [phone] Subjective:   I Nicholas NighKana Arellano am serving as a Neurosurgeonscribe for Dr. Antoine PrimasZachary Dannelle Rhymes.  I'm seeing this patient by the request  of:    CC: Low back pain  BJY:[id number]:Subjective  Nicholas PrimaJoseph Arellano is a 73 y.o. male coming in with complaint of back pain. States he has good and bad days.  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  We discussed which activities of doing which wants to avoid what he has been doing.  States he is approximately 40% better.  Has not noticed any improvement with the gabapentin at this moment.  Patient denies any radiation down the legs.  States that certain things since the hip abductors cause severe pain from time to time.    Past Medical History:  Diagnosis Date  . Gout   . Hypertension    Past Surgical History:  Procedure Laterality Date  . TONSILLECTOMY    . VASECTOMY     Social History   Socioeconomic History  . Marital status: Married    Spouse name: Not on file  . Number of children: Not on file  . Years of education: Not on file  . Highest education level: Not on file  Occupational History  . Not on file  Social Needs  . Financial resource strain: Not on file  . Food insecurity    Worry: Not on file    Inability: Not on file  . Transportation needs    Medical: Not on file    Non-medical: Not on file  Tobacco Use  . Smoking status: Never Smoker  . Smokeless tobacco: Never Used  Substance and Sexual Activity  . Alcohol use: Yes    Comment: socially  . Drug use: No  . Sexual activity: Not on file  Lifestyle  . Physical activity    Days per week: Not on file    Minutes per session: Not on file  . Stress: Not on file  Relationships  . Social Musicianconnections    Talks on phone: Not on file    Gets together: Not on file    Attends religious service: Not on file    Active member of club or organization: Not on file    Attends  meetings of clubs or organizations: Not on file    Relationship status: Not on file  Other Topics Concern  . Not on file  Social History Narrative  . Not on file   No Known Allergies Family History  Problem Relation Age of Onset  . Stroke Mother   . Hypertension Father   . Stroke Father      Current Outpatient Medications (Cardiovascular):  .  olmesartan-hydrochlorothiazide (BENICAR HCT) 20-12.5 MG per tablet, Take 1 tablet by mouth daily.     Current Outpatient Medications (Other):  .  gabapentin (NEURONTIN) 100 MG capsule, Take 2 capsules (200 mg total) by mouth at bedtime.    Past medical history, social, surgical and family history all reviewed in electronic medical record.  No pertanent information unless stated regarding to the chief complaint.   Review of Systems:  No headache, visual changes, nausea, vomiting, diarrhea, constipation, dizziness, abdominal pain, skin rash, fevers, chills, night sweats, weight loss, swollen lymph nodes, body aches, joint swelling,chest pain, shortness of breath, mood changes.  Positive muscle aches  Objective  Blood pressure 140/80, pulse (!) 58, height 5\' 8"  (1.727 m), weight 202 lb (  91.6 kg), SpO2 96 %.    General: No apparent distress alert and oriented x3 mood and affect normal, dressed appropriately.  HEENT: Pupils equal, extraocular movements intact  Respiratory: Patient's speak in full sentences and does not appear short of breath  Cardiovascular: No lower extremity edema, non tender, no erythema  Skin: Warm dry intact with no signs of infection or rash on extremities or on axial skeleton.  Abdomen: Soft nontender  Neuro: Cranial nerves II through XII are intact, neurovascularly intact in all extremities with 2+ DTRs and 2+ pulses.  Lymph: No lymphadenopathy of posterior or anterior cervical chain or axillae bilaterally.  Gait normal with good balance and coordination.  MSK:  Non tender with full range of motion and good  stability and symmetric strength and tone of shoulders, elbows, wrist, hip, knee and ankles bilaterally.  Back Exam:  Inspection: Loss of lordosis Motion: Flexion 25 deg, Extension 25 deg, Side Bending to 35 deg bilaterally,  Rotation to 35 deg bilaterally  SLR laying: Negative  XSLR laying: Negative  Palpable tenderness: Tender to palpation paraspinal musculature lumbar spine right greater than left. FABER: Mild tightness in the right compared to left. Sensory change: Gross sensation intact to all lumbar and sacral dermatomes.  Reflexes: 2+ at both patellar tendons, 2+ at achilles tendons, Babinski's downgoing.  Strength at foot  Plantar-flexion: 5/5 Dorsi-flexion: 5/5 Eversion: 5/5 Inversion: 5/5  Leg strength  Quad: 5/5 Hamstring: 5/5 Hip flexor: 5/5 Hip abductors: 5/5  Gait unremarkable.   Impression and Recommendations:     The above documentation has been reviewed and is accurate and complete Lyndal Pulley, DO       Note: This dictation was prepared with Dragon dictation along with smaller phrase technology. Any transcriptional errors that result from this process are unintentional.

## 2019-03-06 ENCOUNTER — Encounter: Payer: Self-pay | Admitting: Family Medicine

## 2019-03-06 ENCOUNTER — Ambulatory Visit: Payer: Medicare Other | Admitting: Family Medicine

## 2019-03-06 ENCOUNTER — Other Ambulatory Visit: Payer: Self-pay

## 2019-03-06 DIAGNOSIS — M48061 Spinal stenosis, lumbar region without neurogenic claudication: Secondary | ICD-10-CM

## 2019-03-06 NOTE — Patient Instructions (Addendum)
Add YTA exercises Exercises on wall.  Heel and butt touching.  Raise leg 6 inches and hold 2 seconds.  Down slow for count of 4 seconds.  1 set of 30 reps daily on both sides.  Try gabapentin 300mg  at night and if no improvement after 5 days then discontinue Keep doing everything else See em again in 6ish weeks

## 2019-03-06 NOTE — Assessment & Plan Note (Signed)
Patient is having some improvement.  States approximately 40% better.  Patient encouraged to continue the exercises and changing things.  Started to energy some mild back extension but we encouraged him not to do it on a regular basis.  Has not noticed significant improvement with the gabapentin but will try to increase to 300 mg nightly for 5 nights.  If no improvement patient will discontinue.  Patient will follow-up in 6 weeks.  Continue to have difficulty we will consider repeating formal physical therapy for the possibility of MRI with epidurals.

## 2019-03-14 ENCOUNTER — Encounter: Payer: Self-pay | Admitting: Family Medicine

## 2019-03-24 ENCOUNTER — Other Ambulatory Visit: Payer: Self-pay

## 2019-03-24 ENCOUNTER — Ambulatory Visit (INDEPENDENT_AMBULATORY_CARE_PROVIDER_SITE_OTHER): Payer: Medicare Other | Admitting: Vascular Surgery

## 2019-03-24 ENCOUNTER — Encounter (INDEPENDENT_AMBULATORY_CARE_PROVIDER_SITE_OTHER): Payer: Self-pay | Admitting: Vascular Surgery

## 2019-03-24 DIAGNOSIS — I10 Essential (primary) hypertension: Secondary | ICD-10-CM

## 2019-03-24 DIAGNOSIS — I739 Peripheral vascular disease, unspecified: Secondary | ICD-10-CM

## 2019-03-24 DIAGNOSIS — L409 Psoriasis, unspecified: Secondary | ICD-10-CM | POA: Insufficient documentation

## 2019-03-24 NOTE — Assessment & Plan Note (Signed)
blood pressure control important in reducing the progression of atherosclerotic disease. On appropriate oral medications.  

## 2019-03-24 NOTE — Assessment & Plan Note (Signed)
Patient describes symptoms that are concerning for peripheral arterial disease.  This could also be neurogenic claudication with a history of back problems that is also high on the differential.  We discussed the pathophysiology and natural history of peripheral arterial disease we have discussed other alternatives for the symptoms such as neuropathy type pain or neuropathic claudication from spinal disease.  We will obtain noninvasive studies in the near future at his convenience and I will see the patient back following the studies to discuss the results and determine further treatment options.

## 2019-03-24 NOTE — Patient Instructions (Signed)
Peripheral Vascular Disease  Peripheral vascular disease (PVD) is a disease of the blood vessels that are not part of your heart and brain. A simple term for PVD is poor circulation. In most cases, PVD narrows the blood vessels that carry blood from your heart to the rest of your body. This can reduce the supply of blood to your arms, legs, and internal organs, like your stomach or kidneys. However, PVD most often affects a person's lower legs and feet. Without treatment, PVD tends to get worse. PVD can also lead to acute ischemic limb. This is when an arm or leg suddenly cannot get enough blood. This is a medical emergency. Follow these instructions at home: Lifestyle  Do not use any products that contain nicotine or tobacco, such as cigarettes and e-cigarettes. If you need help quitting, ask your doctor.  Lose weight if you are overweight. Or, stay at a healthy weight as told by your doctor.  Eat a diet that is low in fat and cholesterol. If you need help, ask your doctor.  Exercise regularly. Ask your doctor for activities that are right for you. General instructions  Take over-the-counter and prescription medicines only as told by your doctor.  Take good care of your feet: ? Wear comfortable shoes that fit well. ? Check your feet often for any cuts or sores.  Keep all follow-up visits as told by your doctor This is important. Contact a doctor if:  You have cramps in your legs when you walk.  You have leg pain when you are at rest.  You have coldness in a leg or foot.  Your skin changes.  You are unable to get or have an erection (erectile dysfunction).  You have cuts or sores on your feet that do not heal. Get help right away if:  Your arm or leg turns cold, numb, and blue.  Your arms or legs become red, warm, swollen, painful, or numb.  You have chest pain.  You have trouble breathing.  You suddenly have weakness in your face, arm, or leg.  You become very  confused or you cannot speak.  You suddenly have a very bad headache.  You suddenly cannot see. Summary  Peripheral vascular disease (PVD) is a disease of the blood vessels.  A simple term for PVD is poor circulation. Without treatment, PVD tends to get worse.  Treatment may include exercise, low fat and low cholesterol diet, and quitting smoking. This information is not intended to replace advice given to you by your health care provider. Make sure you discuss any questions you have with your health care provider. Document Released: 09/30/2009 Document Revised: 06/18/2017 Document Reviewed: 08/13/2016 Elsevier Patient Education  2020 Elsevier Inc.  

## 2019-03-24 NOTE — Progress Notes (Signed)
Patient ID: Nicholas Arellano, male   DOB: 01-14-1946, 73 y.o.   MRN: [id number]  Chief Complaint  Patient presents with  . New Patient (Initial Visit)    ref Nicholas Arellano for le pain    HPI Nicholas Arellano is a 73 y.o. male.  I am asked to see the patient by Dr. Kary Arellano for evaluation of lower extremity pain. This is predominately the right leg.  The patient reports sometimes this pain wakes him from night.  He is now able to walk about 500 feet before being completely disabled and have to stop and rest.  This has been gradually progressed over several months.  No ulceration or infection.  No clear inciting event or causative factor that started the symptoms.  His primary care physician was concerned about circulatory issues and referred him for further evaluation and treatment.  He did have a father who had bad problems with his circulation.  He has not had any procedures to the veins or arteries of his legs personally to his knowledge.     Past Medical History:  Diagnosis Date  . Gout   . Hypertension     Past Surgical History:  Procedure Laterality Date  . TONSILLECTOMY    . VASECTOMY      Family History Family History  Problem Relation Age of Onset  . Stroke Mother   . Hypertension Father   . Stroke Father   No bleeding or clotting disorders Father had circulation issues  Social History Social History   Tobacco Use  . Smoking status: Never Smoker  . Smokeless tobacco: Never Used  Substance Use Topics  . Alcohol use: Yes    Comment: socially  . Drug use: No     No Known Allergies  Current Outpatient Medications  Medication Sig Dispense Refill  . olmesartan-hydrochlorothiazide (BENICAR HCT) 20-12.5 MG per tablet Take 1 tablet by mouth daily.    Marland Kitchen gabapentin (NEURONTIN) 100 MG capsule Take 2 capsules (200 mg total) by mouth at bedtime. 180 capsule 0   No current facility-administered medications for this visit.       REVIEW OF SYSTEMS (Negative unless checked)   Constitutional: [] Weight loss  [] Fever  [] Chills Cardiac: [] Chest pain   [] Chest pressure   [] Palpitations   [] Shortness of breath when laying flat   [] Shortness of breath at rest   [] Shortness of breath with exertion. Vascular:  [x] Pain in legs with walking   [] Pain in legs at rest   [] Pain in legs when laying flat   [x] Claudication   [] Pain in feet when walking  [] Pain in feet at rest  [] Pain in feet when laying flat   [] History of DVT   [] Phlebitis   [] Swelling in legs   [] Varicose veins   [] Non-healing ulcers Pulmonary:   [] Uses home oxygen   [] Productive cough   [] Hemoptysis   [] Wheeze  [] COPD   [] Asthma Neurologic:  [] Dizziness  [] Blackouts   [] Seizures   [] History of stroke   [] History of TIA  [] Aphasia   [] Temporary blindness   [] Dysphagia   [] Weakness or numbness in arms   [] Weakness or numbness in legs Musculoskeletal:  [x] Arthritis   [] Joint swelling   [] Joint pain   [x] Low back pain Hematologic:  [] Easy bruising  [] Easy bleeding   [] Hypercoagulable state   [] Anemic  [] Hepatitis Gastrointestinal:  [] Blood in stool   [] Vomiting blood  [] Gastroesophageal reflux/heartburn   [] Abdominal pain Genitourinary:  [] Chronic kidney disease   [] Difficult urination  [] Frequent urination  [] Burning  with urination   [] Hematuria Skin:  [] Rashes   [] Ulcers   [] Wounds Psychological:  [] History of anxiety   []  History of major depression.    Physical Exam BP (!) 180/87 (BP Location: Right Arm)   Pulse (!) 50   Resp 16   Ht 5\' 8"  (1.727 m)   Wt 202 lb 9.6 oz (91.9 kg)   BMI 30.81 kg/m  Gen:  WD/WN, NAD.  Appears younger than stated age Head: Nicholas Arellano, No temporalis wasting.  Ear/Nose/Throat: Hearing grossly intact, nares w/o erythema or drainage, oropharynx w/o Erythema/Exudate Eyes: Conjunctiva clear, sclera non-icteric  Neck: trachea midline.  No JVD.  Pulmonary:  Good air movement, respirations not labored, no use of accessory muscles  Cardiac: RRR, no JVD Vascular:  Vessel Right Left  Radial  Palpable Palpable                          PT  1+  1+  DP  1+  2+   Gastrointestinal:. No masses, surgical incisions, or scars. Musculoskeletal: M/S 5/5 throughout.  Extremities without ischemic changes.  No deformity or atrophy.  Scattered varicosities bilaterally.  No appreciable edema. Neurologic: Sensation grossly intact in extremities.  Symmetrical.  Speech is fluent. Motor exam as listed above. Psychiatric: Judgment intact, Mood & affect appropriate for pt's clinical situation. Dermatologic: No rashes or ulcers noted.  No cellulitis or open wounds.    Radiology No results found.  Labs No results found for this or any previous visit (from the past 2160 hour(s)).  Assessment/Plan:  Essential hypertension blood pressure control important in reducing the progression of atherosclerotic disease. On appropriate oral medications.   Claudication [facility](HCC) Patient describes symptoms that are concerning for peripheral arterial disease.  This could also be neurogenic claudication with a history of back problems that is also high on the differential.  We discussed the pathophysiology and natural history of peripheral arterial disease we have discussed other alternatives for the symptoms such as neuropathy type pain or neuropathic claudication from spinal disease.  We will obtain noninvasive studies in the near future at his convenience and I will see the patient back following the studies to discuss the results and determine further treatment options.      Nicholas Arellano 03/24/2019, 11:17 AM   This note was created with Dragon medical transcription system.  Any errors from dictation are unintentional.

## 2019-03-29 ENCOUNTER — Ambulatory Visit (INDEPENDENT_AMBULATORY_CARE_PROVIDER_SITE_OTHER): Payer: Medicare Other | Admitting: Nurse Practitioner

## 2019-03-29 ENCOUNTER — Other Ambulatory Visit: Payer: Self-pay

## 2019-03-29 ENCOUNTER — Encounter (INDEPENDENT_AMBULATORY_CARE_PROVIDER_SITE_OTHER): Payer: Self-pay | Admitting: Nurse Practitioner

## 2019-03-29 ENCOUNTER — Ambulatory Visit (INDEPENDENT_AMBULATORY_CARE_PROVIDER_SITE_OTHER): Payer: Medicare Other

## 2019-03-29 VITALS — BP 160/79 | HR 55 | Resp 12 | Ht 68.0 in | Wt 202.0 lb

## 2019-03-29 DIAGNOSIS — I739 Peripheral vascular disease, unspecified: Secondary | ICD-10-CM

## 2019-03-29 DIAGNOSIS — I10 Essential (primary) hypertension: Secondary | ICD-10-CM

## 2019-03-29 DIAGNOSIS — M48061 Spinal stenosis, lumbar region without neurogenic claudication: Secondary | ICD-10-CM

## 2019-03-29 NOTE — Progress Notes (Signed)
SUBJECTIVE:  Patient ID: Nicholas Arellano, male    DOB: [birth year], 73 y.o.   MRN: [id number] Chief Complaint  Patient presents with  . Follow-up    HPI  Nicholas Arellano is a 73 y.o. male the presents today for evaluation due to leg cramping.  The patient states that the leg cramping occurs within his calf on the backs of his thighs.  This is been ongoing for about 4 to 5 months.  He states that mainly the cramps happen when he is waking up or he moves from a sitting to standing position.  The patient has tried magnesium supplements as well as other over-the-counter supplements for leg cramps which neither have had much of relief.  The patient was concerned about this cramping due to the fact that his father has a history of peripheral artery disease.  The patient denies any cramping when he is ambulating for long distances.  In fact he states that he can walk 10 to 12,000 steps per day without issue.  The patient endorses having issues with lower back pain.  He denies any rest pain like symptoms.  He denies any fever, chills, nausea, vomiting or diarrhea.  Past Medical History:  Diagnosis Date  . Gout   . Hypertension     Past Surgical History:  Procedure Laterality Date  . TONSILLECTOMY    . VASECTOMY      Social History   Socioeconomic History  . Marital status: Married    Spouse name: Not on file  . Number of children: Not on file  . Years of education: Not on file  . Highest education level: Not on file  Occupational History  . Not on file  Social Needs  . Financial resource strain: Not on file  . Food insecurity    Worry: Not on file    Inability: Not on file  . Transportation needs    Medical: Not on file    Non-medical: Not on file  Tobacco Use  . Smoking status: Never Smoker  . Smokeless tobacco: Never Used  Substance and Sexual Activity  . Alcohol use: Yes    Comment: socially  . Drug use: No  . Sexual activity: Not on file  Lifestyle  . Physical activity   Days per week: Not on file    Minutes per session: Not on file  . Stress: Not on file  Relationships  . Social Herbalist on phone: Not on file    Gets together: Not on file    Attends religious service: Not on file    Active member of club or organization: Not on file    Attends meetings of clubs or organizations: Not on file    Relationship status: Not on file  . Intimate partner violence    Fear of current or ex partner: Not on file    Emotionally abused: Not on file    Physically abused: Not on file    Forced sexual activity: Not on file  Other Topics Concern  . Not on file  Social History Narrative  . Not on file    Family History  Problem Relation Age of Onset  . Stroke Mother   . Hypertension Father   . Stroke Father     No Known Allergies   Review of Systems   Review of Systems: Negative Unless Checked Constitutional: [] Weight loss  [] Fever  [] Chills Cardiac: [] Chest pain   []  Atrial Fibrillation  [] Palpitations   [] Shortness of  breath when laying flat   [] Shortness of breath with exertion. [] Shortness of breath at rest Vascular:  [] Pain in legs with walking   [] Pain in legs with standing [] Pain in legs when laying flat   [x] Claudication    [] Pain in feet when laying flat    [] History of DVT   [] Phlebitis   [] Swelling in legs   [] Varicose veins   [] Non-healing ulcers Pulmonary:   [] Uses home oxygen   [] Productive cough   [] Hemoptysis   [] Wheeze  [] COPD   [] Asthma Neurologic:  [] Dizziness   [] Seizures  [] Blackouts [] History of stroke   [] History of TIA  [] Aphasia   [] Temporary Blindness   [] Weakness or numbness in arm   [] Weakness or numbness in leg Musculoskeletal:   [] Joint swelling   [] Joint pain   [] Low back pain  []  History of Knee Replacement [x] Arthritis [] back Surgeries  [x]  Spinal Stenosis    Hematologic:  [] Easy bruising  [] Easy bleeding   [] Hypercoagulable state   [] Anemic Gastrointestinal:  [] Diarrhea   [] Vomiting  [] Gastroesophageal  reflux/heartburn   [] Difficulty swallowing. [] Abdominal pain Genitourinary:  [] Chronic kidney disease   [] Difficult urination  [] Anuric   [] Blood in urine [] Frequent urination  [] Burning with urination   [] Hematuria Skin:  [] Rashes   [] Ulcers [] Wounds Psychological:  [] History of anxiety   []  History of major depression  []  Memory Difficulties      OBJECTIVE:   Physical Exam  BP (!) 160/79 (BP Location: Left Arm, Patient Position: Sitting, Cuff Size: Normal)   Pulse (!) 55   Resp 12   Ht 5\' 8"  (1.727 m)   Wt 202 lb (91.6 kg)   BMI 30.71 kg/m   Gen: WD/WN, NAD Head: [place]/AT, No temporalis wasting.  Ear/Nose/Throat: Hearing grossly intact, nares w/o erythema or drainage Eyes: PER, EOMI, sclera nonicteric.  Neck: Supple, no masses.  No JVD.  Pulmonary:  Good air movement, no use of accessory muscles.  Cardiac: RRR Vascular: scattered spider veins Vessel Right Left  Radial Palpable Palpable  Dorsalis Pedis Palpable Palpable  Posterior Tibial Palpable Palpable   Gastrointestinal: soft, non-distended. No guarding/no peritoneal signs.  Musculoskeletal: M/S 5/5 throughout.  No deformity or atrophy.  Neurologic: Pain and light touch intact in extremities.  Symmetrical.  Speech is fluent. Motor exam as listed above. Psychiatric: Judgment intact, Mood & affect appropriate for pt's clinical situation. Dermatologic: No Venous rashes. No Ulcers Noted.  No changes consistent with cellulitis. Lymph : No Cervical lymphadenopathy, no lichenification or skin changes of chronic lymphedema.       ASSESSMENT AND PLAN:  1. Claudication [facility](HCC) Recommend:  I do not find evidence of Vascular pathology that would explain the patient's symptoms  The patient has atypical pain symptoms for vascular disease  Noninvasive studies including venous ultrasound of the legs do not identify vascular problems  The patient should continue walking and begin a more formal exercise program. The patient should  continue his antiplatelet therapy and aggressive treatment of the lipid abnormalities. The patient should begin wearing graduated compression socks 15-20 mmHg strength to control her mild edema.  Patient will follow-up with me on a PRN basis  Further work-up of her lower extremity pain is deferred to the primary service     2. Essential hypertension Continue antihypertensive medications as already ordered, these medications have been reviewed and there are no changes at this time.   3. Degenerative lumbar spinal stenosis Continue NSAID medications as already ordered, these medications have been reviewed and there are no  changes at this time.  Continued activity and therapy was stressed.    Current Outpatient Medications on File Prior to Visit  Medication Sig Dispense Refill  . olmesartan-hydrochlorothiazide (BENICAR HCT) 20-12.5 MG per tablet Take 1 tablet by mouth daily.    . [DISCONTINUED] fluticasone (FLONASE) 50 MCG/ACT nasal spray Place 2 sprays into both nostrils daily. 16 g 0   No current facility-administered medications on file prior to visit.     There are no Patient Instructions on file for this visit. No follow-ups on file.   Georgiana Spinner, NP  This note was completed with Office manager.  Any errors are purely unintentional.

## 2019-04-18 ENCOUNTER — Encounter: Payer: Self-pay | Admitting: Family Medicine

## 2019-04-18 ENCOUNTER — Ambulatory Visit (INDEPENDENT_AMBULATORY_CARE_PROVIDER_SITE_OTHER): Payer: Medicare Other | Admitting: Family Medicine

## 2019-04-18 ENCOUNTER — Other Ambulatory Visit: Payer: Self-pay

## 2019-04-18 DIAGNOSIS — M48061 Spinal stenosis, lumbar region without neurogenic claudication: Secondary | ICD-10-CM

## 2019-04-18 NOTE — Assessment & Plan Note (Signed)
Patient has made significant strides at this time.  Encourage patient to continue the home exercises, icing regimen, which activities to do which wants to avoid.  As long as patient continues to do well we will not make any significant changes and follow-up with me again in 3 months

## 2019-04-18 NOTE — Progress Notes (Signed)
[facility] [address] [place], [place] [address] Phone: [phone] Subjective:   I, [name], am serving as a scribe for Dr. Hulan Saas.   CC: Low back pain follow-up  QAS:[id number]   03/06/2019 Patient is having some improvement.  States approximately 40% better.  Patient encouraged to continue the exercises and changing things.  Started to energy some mild back extension but we encouraged him not to do it on a regular basis.  Has not noticed significant improvement with the gabapentin but will try to increase to 300 mg nightly for 5 nights.  If no improvement patient will discontinue.  Patient will follow-up in 6 weeks.  Continue to have difficulty we will consider repeating formal physical therapy for the possibility of MRI with epidurals.  Update 04/18/2019 Nicholas Arellano is a 73 y.o. male coming in with complaint of back pain. States his back has been better than usual lately. Occasional pain.  Patient feels like he continues to make improvement.  States that he is 60 to 70% better at this time.  Reports only 1 day where his back pain seemed to stop him from certain activities.  Other than that feels like making some progress.  Rates the severity of pain is 2 out of 10 most of the time now.     Past Medical History:  Diagnosis Date  . Gout   . Hypertension    Past Surgical History:  Procedure Laterality Date  . TONSILLECTOMY    . VASECTOMY     Social History   Socioeconomic History  . Marital status: Married    Spouse name: Not on file  . Number of children: Not on file  . Years of education: Not on file  . Highest education level: Not on file  Occupational History  . Not on file  Social Needs  . Financial resource strain: Not on file  . Food insecurity    Worry: Not on file    Inability: Not on file  . Transportation needs    Medical: Not on file    Non-medical: Not on file  Tobacco Use  . Smoking status: Never Smoker  .  Smokeless tobacco: Never Used  Substance and Sexual Activity  . Alcohol use: Yes    Comment: socially  . Drug use: No  . Sexual activity: Not on file  Lifestyle  . Physical activity    Days per week: Not on file    Minutes per session: Not on file  . Stress: Not on file  Relationships  . Social Herbalist on phone: Not on file    Gets together: Not on file    Attends religious service: Not on file    Active member of club or organization: Not on file    Attends meetings of clubs or organizations: Not on file    Relationship status: Not on file  Other Topics Concern  . Not on file  Social History Narrative  . Not on file   No Known Allergies Family History  Problem Relation Age of Onset  . Stroke Mother   . Hypertension Father   . Stroke Father      Current Outpatient Medications (Cardiovascular):  .  olmesartan-hydrochlorothiazide (BENICAR HCT) 20-12.5 MG per tablet, Take 1 tablet by mouth daily.        Past medical history, social, surgical and family history all reviewed in electronic medical record.  No pertanent information unless stated regarding to  the chief complaint.   Review of Systems:  No headache, visual changes, nausea, vomiting, diarrhea, constipation, dizziness, abdominal pain, skin rash, fevers, chills, night sweats, weight loss, swollen lymph nodes, body aches, joint swelling,  chest pain, shortness of breath, mood changes.  Positive muscle aches  Objective  Blood pressure 126/80, pulse (!) 55, height 5\' 8"  (1.727 m), weight 202 lb (91.6 kg), SpO2 93 %.    General: No apparent distress alert and oriented x3 mood and affect normal, dressed appropriately.  HEENT: Pupils equal, extraocular movements intact  Respiratory: Patient's speak in full sentences and does not appear short of breath  Cardiovascular: No lower extremity edema, non tender, no erythema  Skin: Warm dry intact with no signs of infection or rash on extremities or on axial  skeleton.  Abdomen: Soft nontender  Neuro: Cranial nerves II through XII are intact, neurovascularly intact in all extremities with 2+ DTRs and 2+ pulses.  Lymph: No lymphadenopathy of posterior or anterior cervical chain or axillae bilaterally.  Gait normal with good balance and coordination.  MSK:  Non tender with full range of motion and good stability and symmetric strength and tone of shoulders, elbows, wrist, hip, knee and ankles bilaterally.  Back exam does have some loss of lordosis.  Tightness noted of the lumbar spine.  Patient has some mild limited range of motion in extension as well as side bending.  Patient does have tightness in the [place] test but improvement noted.    Impression and Recommendations:      The above documentation has been reviewed and is accurate and complete [place], DO       Note: This dictation was prepared with Dragon dictation along with smaller phrase technology. Any transcriptional errors that result from this process are unintentional.

## 2019-04-18 NOTE — Patient Instructions (Addendum)
Good to see you Keep up what you are doing See you again in 3 months

## 2019-07-19 ENCOUNTER — Encounter: Payer: Self-pay | Admitting: Family Medicine

## 2019-07-19 ENCOUNTER — Ambulatory Visit: Payer: Medicare Other | Admitting: Family Medicine

## 2019-07-19 ENCOUNTER — Other Ambulatory Visit: Payer: Self-pay

## 2019-07-19 DIAGNOSIS — M48061 Spinal stenosis, lumbar region without neurogenic claudication: Secondary | ICD-10-CM

## 2019-07-19 NOTE — Patient Instructions (Signed)
Thanks for making everything easy Consider Turmeric 500mg  daily See me when you need me!

## 2019-07-19 NOTE — Progress Notes (Signed)
[facility] [address] [place] [address] Phone: [phone] Subjective:    I'm seeing this patient by the request  of:    CC: Back pain, leg pain follow-up  EVO:[id number]   I, Wilford Grist, am serving as a scribe for Dr. Antoine Primas. This visit occurred during the SARS-CoV-2 public health emergency.  Safety protocols were in place, including screening questions prior to the visit, additional usage of staff PPE, and extensive cleaning of exam room while observing appropriate contact time as indicated for disinfecting solutions.   04/18/2019 Patient has made significant strides at this time.  Encourage patient to continue the home exercises, icing regimen, which activities to do which wants to avoid.  As long as patient continues to do well we will not make any significant changes and follow-up with me again in 3 months  Update 07/19/2019 Nicholas Arellano is a 73 y.o. male coming in with complaint of back pain. Patient states that all of the cramping in his back are gone. Is seeing chiropractor. Was having left sided neck tingling and left leg tingling when looking up. These symptoms have gone away since treatment yesterday.  Patient would state 95% better overall.    Past Medical History:  Diagnosis Date  . Gout   . Hypertension    Past Surgical History:  Procedure Laterality Date  . TONSILLECTOMY    . VASECTOMY     Social History   Socioeconomic History  . Marital status: Married    Spouse name: Not on file  . Number of children: Not on file  . Years of education: Not on file  . Highest education level: Not on file  Occupational History  . Not on file  Tobacco Use  . Smoking status: Never Smoker  . Smokeless tobacco: Never Used  Substance and Sexual Activity  . Alcohol use: Yes    Comment: socially  . Drug use: No  . Sexual activity: Not on file  Other Topics Concern  . Not on file  Social History Narrative  . Not on file    Social Determinants of Health   Financial Resource Strain:   . Difficulty of Paying Living Expenses: Not on file  Food Insecurity:   . Worried About Programme researcher, broadcasting/film/video in the Last Year: Not on file  . Ran Out of Food in the Last Year: Not on file  Transportation Needs:   . Lack of Transportation (Medical): Not on file  . Lack of Transportation (Non-Medical): Not on file  Physical Activity:   . Days of Exercise per Week: Not on file  . Minutes of Exercise per Session: Not on file  Stress:   . Feeling of Stress : Not on file  Social Connections:   . Frequency of Communication with Friends and Family: Not on file  . Frequency of Social Gatherings with Friends and Family: Not on file  . Attends Religious Services: Not on file  . Active Member of Clubs or Organizations: Not on file  . Attends Banker Meetings: Not on file  . Marital Status: Not on file   No Known Allergies Family History  Problem Relation Age of Onset  . Stroke Mother   . Hypertension Father   . Stroke Father      Current Outpatient Medications (Cardiovascular):  .  olmesartan-hydrochlorothiazide (BENICAR HCT) 20-12.5 MG per tablet, Take 1 tablet by mouth daily.        Past medical history, social, surgical  and family history all reviewed in electronic medical record.  No pertanent information unless stated regarding to the chief complaint.   Review of Systems:  No headache, visual changes, nausea, vomiting, diarrhea, constipation, dizziness, abdominal pain, skin rash, fevers, chills, night sweats, weight loss, swollen lymph nodes, body aches, joint swelling, muscle aches, chest pain, shortness of breath, mood changes.   Objective  Blood pressure 132/74, pulse 61, height 5\' 8"  (1.727 m), weight 207 lb (93.9 kg), SpO2 98 %.   General: No apparent distress alert and oriented x3 mood and affect normal, dressed appropriately.  HEENT: Pupils equal, extraocular movements intact  Respiratory:  Patient's speak in full sentences and does not appear short of breath  Cardiovascular: No lower extremity edema, non tender, no erythema  Skin: Warm dry intact with no signs of infection or rash on extremities or on axial skeleton.  Abdomen: Soft nontender  Neuro: Cranial nerves II through XII are intact, neurovascularly intact in all extremities with 2+ DTRs and 2+ pulses.  Lymph: No lymphadenopathy of posterior or anterior cervical chain or axillae bilaterally.  Gait normal with good balance and coordination.  MSK:  Non tender with full range of motion and good stability and symmetric strength and tone of shoulders, elbows, wrist, hip, knee and ankles bilaterally.  Mild arthritic changes of multiple joints.  Back exam some very mild loss of lordosis.  Patient noted sitting comfortably in the chair.  Patient is able to get out of the chair without any significant difficulty.    Impression and Recommendations:     The above documentation has been reviewed and is accurate and complete Lyndal Pulley, DO       Note: This dictation was prepared with Dragon dictation along with smaller phrase technology. Any transcriptional errors that result from this process are unintentional.

## 2019-07-19 NOTE — Assessment & Plan Note (Signed)
Patient is doing remarkably better.  Patient does have the spinal stenosis as well as signs and symptoms more consistent with claudication.  Patient though is now 95% better not having any significant difficulties.  Patient is to follow-up with me more on an as-needed basis.

## 2019-08-31 ENCOUNTER — Ambulatory Visit: Payer: Medicare Other | Attending: Internal Medicine

## 2019-08-31 ENCOUNTER — Encounter: Payer: Self-pay | Admitting: Pediatrics

## 2019-08-31 DIAGNOSIS — Z23 Encounter for immunization: Secondary | ICD-10-CM | POA: Insufficient documentation

## 2019-08-31 NOTE — Progress Notes (Signed)
   Covid-19 Vaccination Clinic  Name:  Nicholas Arellano    MRN: [id number] DOB: 10/21/1945  08/31/2019  [name] was observed post Covid-19 immunization for 15 minutes without incidence. He was provided with Vaccine Information Sheet and instruction to access the V-Safe system.   [name] was instructed to call 911 with any severe reactions post vaccine: Marland Kitchen Difficulty breathing  . Swelling of your face and throat  . A fast heartbeat  . A bad rash all over your body  . Dizziness and weakness    Immunizations Administered    Name Date Dose VIS Date Route   Pfizer COVID-19 Vaccine 08/31/2019  8:37 AM 0.3 mL 06/30/2019 Intramuscular   Manufacturer: [employer], [employer]   Lot: [id number]   NDC: [address]-4

## 2019-09-27 ENCOUNTER — Ambulatory Visit: Payer: Medicare Other | Attending: Internal Medicine

## 2019-09-27 DIAGNOSIS — Z23 Encounter for immunization: Secondary | ICD-10-CM

## 2019-09-27 NOTE — Progress Notes (Signed)
   Covid-19 Vaccination Clinic  Name:  Nicholas Arellano    MRN: [id number] DOB: 1945/10/12  09/27/2019  [name] was observed post Covid-19 immunization for 15 minutes without incident. He was provided with Vaccine Information Sheet and instruction to access the V-Safe system.   [name] was instructed to call 911 with any severe reactions post vaccine: Marland Kitchen Difficulty breathing  . Swelling of face and throat  . A fast heartbeat  . A bad rash all over body  . Dizziness and weakness   Immunizations Administered    Name Date Dose VIS Date Route   Pfizer COVID-19 Vaccine 09/27/2019  3:08 PM 0.3 mL 06/30/2019 Intramuscular   Manufacturer: [employer], [employer]   Lot: [id number]   NDC: [address]-8

## 2019-10-03 ENCOUNTER — Ambulatory Visit: Payer: Medicare Other

## 2020-01-10 ENCOUNTER — Other Ambulatory Visit: Payer: Self-pay | Admitting: Physical Medicine & Rehabilitation

## 2020-01-10 DIAGNOSIS — G8929 Other chronic pain: Secondary | ICD-10-CM

## 2020-01-23 ENCOUNTER — Other Ambulatory Visit: Payer: Self-pay

## 2020-01-23 ENCOUNTER — Ambulatory Visit
Admission: RE | Admit: 2020-01-23 | Discharge: 2020-01-23 | Disposition: A | Discharge status: Home / Self Care | Payer: Medicare Other | Source: Ambulatory Visit | Attending: Physical Medicine & Rehabilitation | Admitting: Physical Medicine & Rehabilitation

## 2020-01-23 DIAGNOSIS — G8929 Other chronic pain: Secondary | ICD-10-CM | POA: Insufficient documentation

## 2020-01-23 DIAGNOSIS — M5442 Lumbago with sciatica, left side: Secondary | ICD-10-CM | POA: Insufficient documentation

## 2020-05-28 DIAGNOSIS — G8929 Other chronic pain: Secondary | ICD-10-CM | POA: Insufficient documentation

## 2021-04-16 DIAGNOSIS — R809 Proteinuria, unspecified: Secondary | ICD-10-CM | POA: Insufficient documentation

## 2021-04-16 DIAGNOSIS — R82998 Other abnormal findings in urine: Secondary | ICD-10-CM | POA: Insufficient documentation

## 2021-04-16 DIAGNOSIS — N1831 Chronic kidney disease, stage 3a: Secondary | ICD-10-CM | POA: Insufficient documentation

## 2021-04-23 ENCOUNTER — Other Ambulatory Visit: Payer: Self-pay | Admitting: Nephrology

## 2021-04-23 ENCOUNTER — Other Ambulatory Visit (HOSPITAL_COMMUNITY): Payer: Self-pay | Admitting: Nephrology

## 2021-04-23 DIAGNOSIS — R82998 Other abnormal findings in urine: Secondary | ICD-10-CM

## 2021-04-23 DIAGNOSIS — N1831 Chronic kidney disease, stage 3a: Secondary | ICD-10-CM

## 2021-04-30 ENCOUNTER — Ambulatory Visit
Admission: RE | Admit: 2021-04-30 | Discharge: 2021-04-30 | Disposition: A | Discharge status: Home / Self Care | Payer: Medicare Other | Source: Ambulatory Visit | Attending: Nephrology | Admitting: Nephrology

## 2021-04-30 ENCOUNTER — Other Ambulatory Visit: Payer: Self-pay

## 2021-04-30 DIAGNOSIS — R82998 Other abnormal findings in urine: Secondary | ICD-10-CM | POA: Insufficient documentation

## 2021-04-30 DIAGNOSIS — N1831 Chronic kidney disease, stage 3a: Secondary | ICD-10-CM | POA: Diagnosis present

## 2022-04-01 DIAGNOSIS — E538 Deficiency of other specified B group vitamins: Secondary | ICD-10-CM | POA: Insufficient documentation

## 2022-04-07 IMAGING — US US RENAL
1 series · 14 of 25 positions shown · non-contrast
Comparison: None.

CLINICAL DATA: Chronic kidney disease

EXAM:
RENAL / URINARY TRACT ULTRASOUND COMPLETE

[Series 1: renal · 14 of 82 slices shown]
[im 1/82]
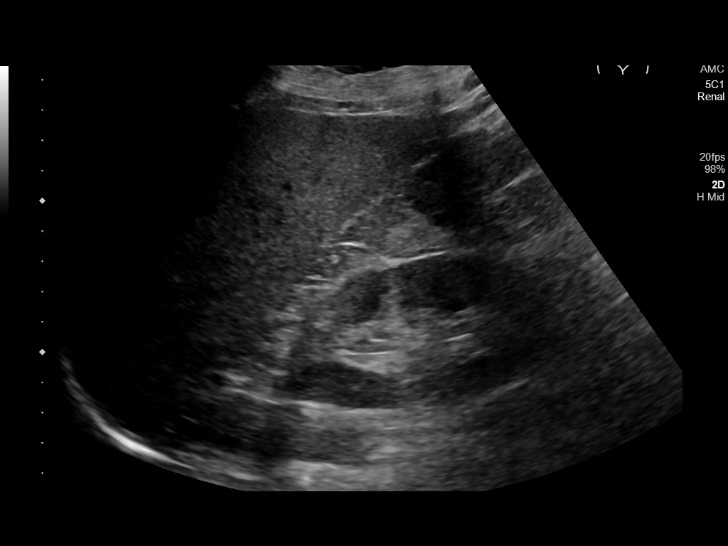
[im 7/82]
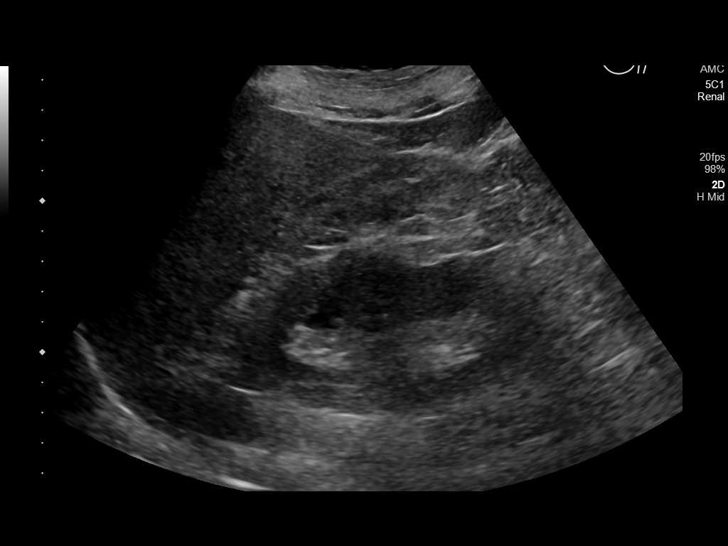
[im 14/82]
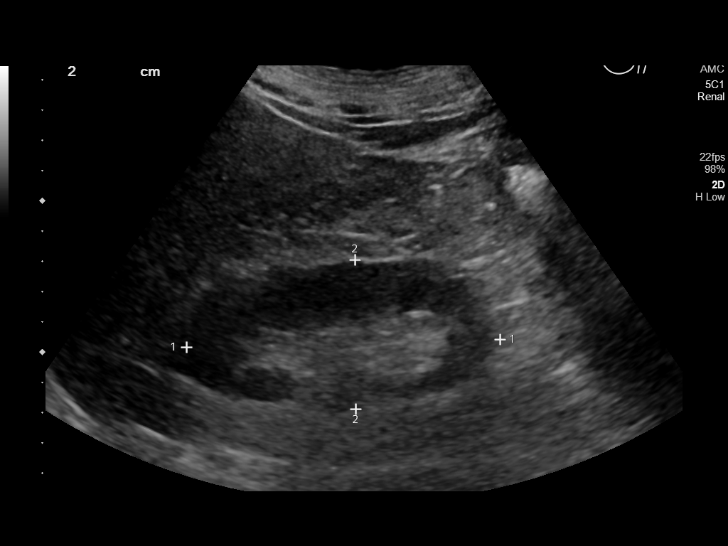
[im 21/82]
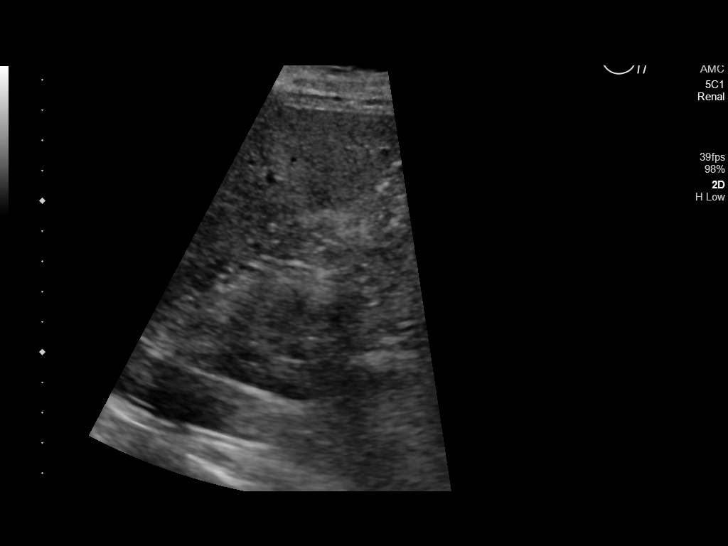
[im 28/82]
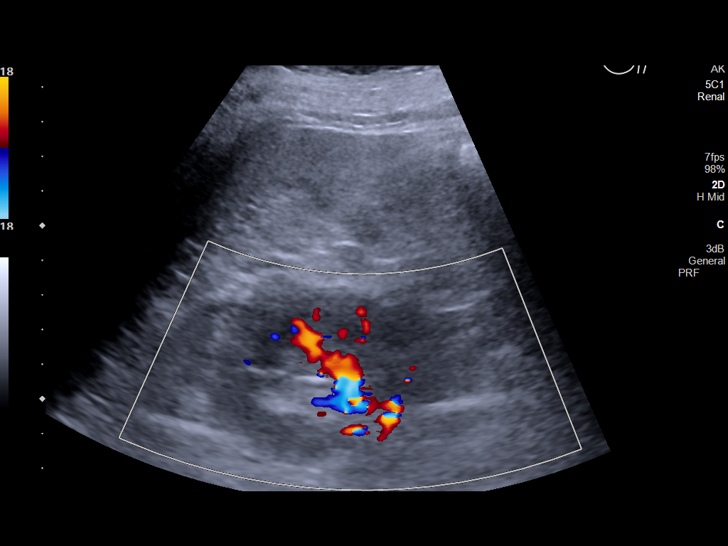
[im 31/82]
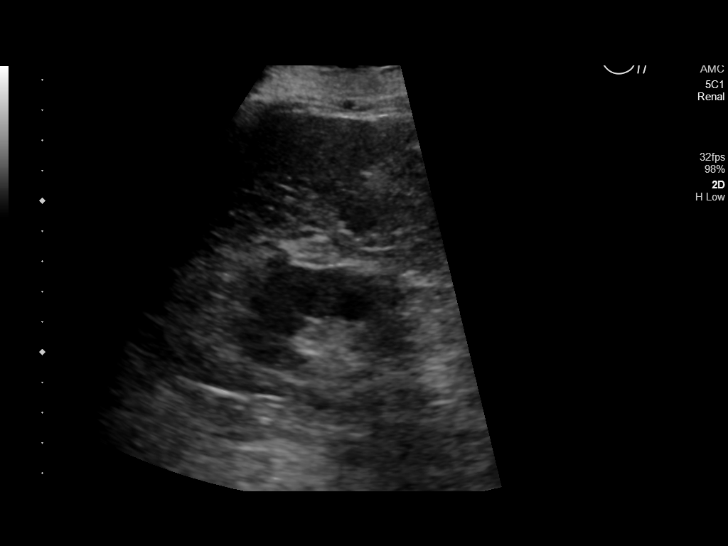
[im 38/82]
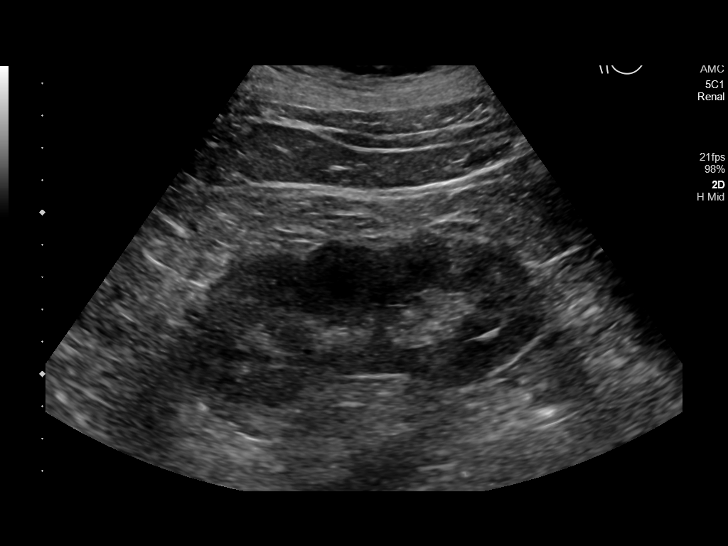
[im 44/82]
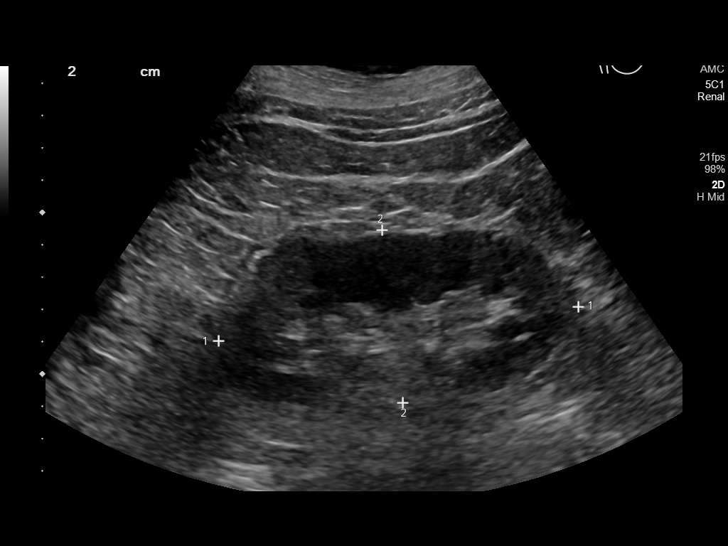
[im 51/82]
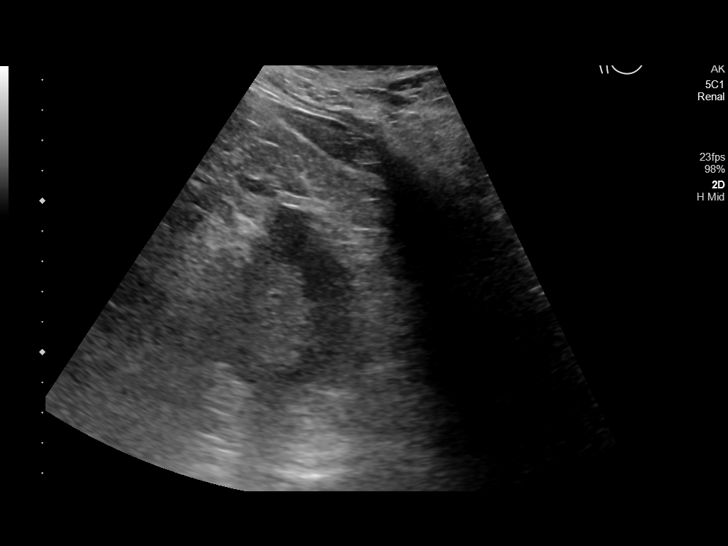
[im 55/82]
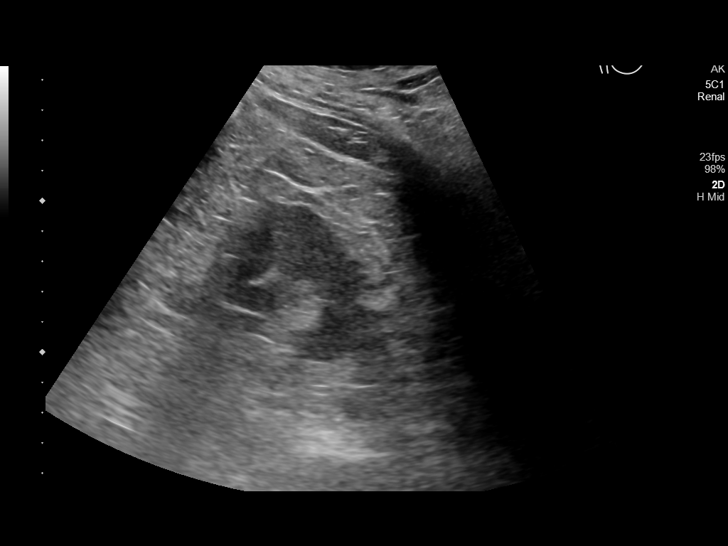
[im 61/82]
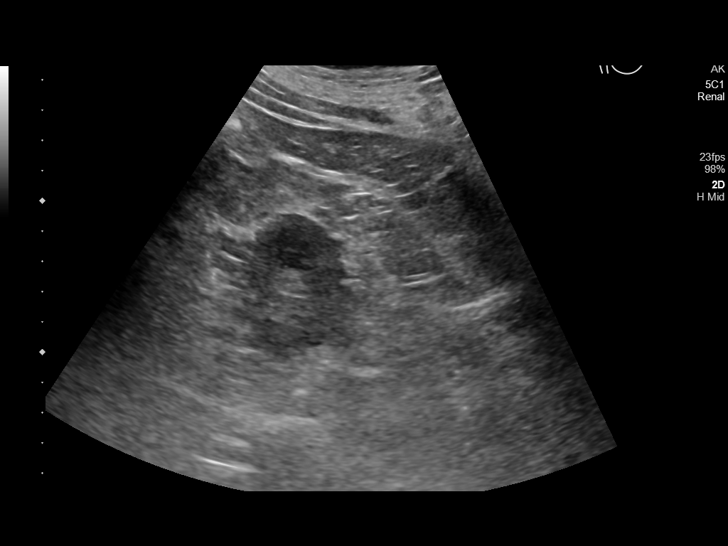
[im 68/82]
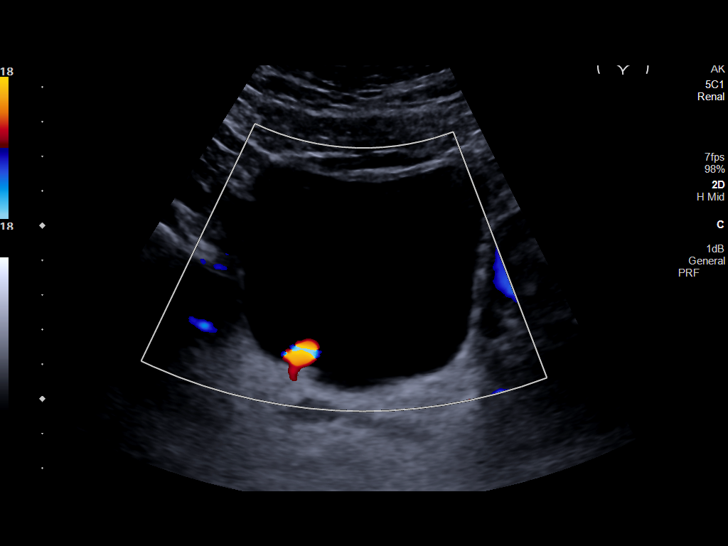
[im 75/82]
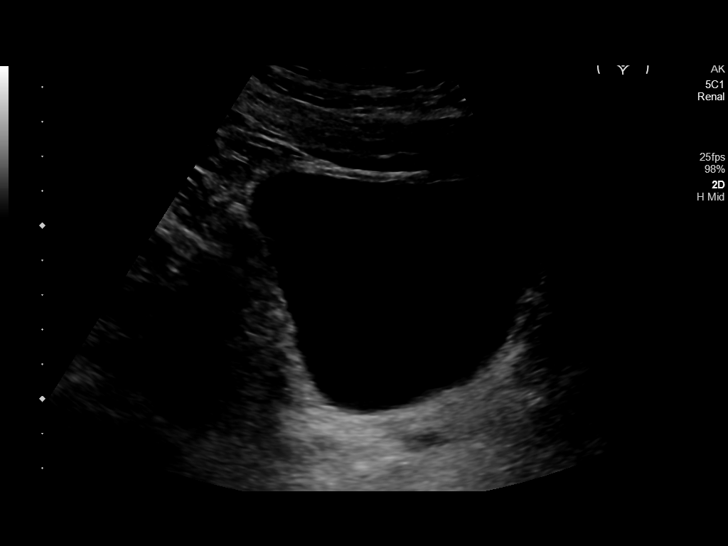
[im 82/82]
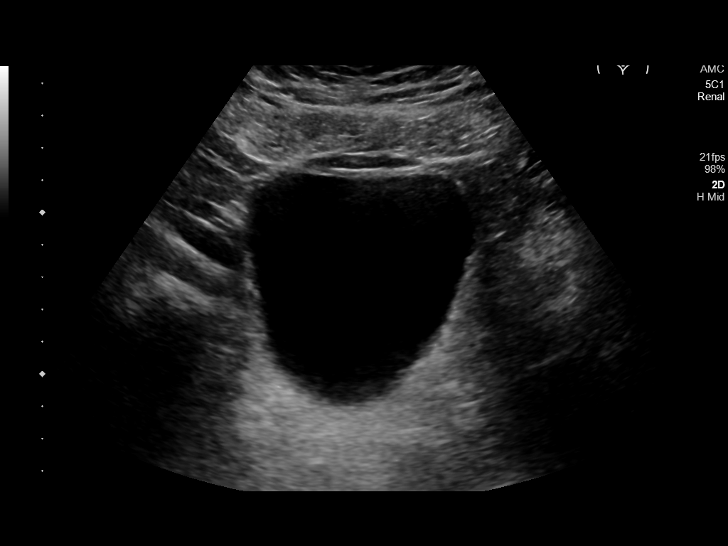

[14 of 25 positions shown; findings below may reference images not displayed]

FINDINGS: Right Kidney:

Renal measurements: 10.4 x 4.9 x 5.8 cm = volume: 143 mL.
Echogenicity within normal limits. No mass or hydronephrosis
visualized.

Left Kidney:

Renal measurements: 11.2 x 5.4 x 4.1 cm = volume: 129 mL.
Echogenicity within normal limits. No mass or hydronephrosis
visualized.

Bladder:

Appears normal for degree of bladder distention.

Other:

None.
IMPRESSION: Unremarkable renal ultrasound.

## 2022-06-14 ENCOUNTER — Ambulatory Visit
Admission: EM | Admit: 2022-06-14 | Discharge: 2022-06-14 | Disposition: A | Discharge status: Home / Self Care | Payer: Medicare Other

## 2022-06-14 DIAGNOSIS — M79671 Pain in right foot: Secondary | ICD-10-CM | POA: Diagnosis not present

## 2022-06-14 MED ORDER — PREDNISONE 20 MG PO TABS: ORAL_TABLET | ORAL | 0 refills | Status: AC

## 2022-06-14 NOTE — Discharge Instructions (Addendum)
Recommend follow-up with your primary care provider regarding your uric acid level.  Possibly consider review of hydrochlorothiazide as a current treatment for blood pressure.

## 2022-06-14 NOTE — ED Provider Notes (Signed)
Ren[facility]    CSN: [id number] Arrival date & time: 06/14/22  1105      History   Chief Complaint Chief Complaint  Patient presents with   Foot Pain    HPI Nicholas Arellano is a 76 y.o. male.    Foot Pain   Presents to urgent care with complaint of right great toe pain and swelling starting 4 days ago.  Patient endorses history of gout with very infrequent flares.  States last flare was about 2 years ago.  Also states he is very careful about keeping his uric acid level low.  Not treated with allopurinol.  MAR indicates use of hydrochlorothiazide for blood pressure.  He endorses some increased pain and redness in the second phalanx of his right foot today.  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 Authorizing Provider  ATORVASTATIN CALCIUM PO Take by mouth. 04/22/22 04/22/23 Yes [provider]  cephALEXin (KEFLEX) 500 MG capsule Take 500 mg by mouth 3 (three) times daily. 01/15/22  Yes [provider]  cyanocobalamin (VITAMIN B12) 1000 MCG/ML injection Inject into the muscle. 04/02/22  Yes [provider]  gabapentin (NEURONTIN) 300 MG capsule TAKE 1 TABLET BY MOUTH IN THE MORNING, 1 TABLET IN THE AFTERNOON AND 2 TABLETS IN THE EVENING TIME 11/26/20  Yes [provider]   hydrochlorothiazide (HYDRODIURIL) 12.5 MG tablet Take by mouth. 08/14/20  Yes [provider]  predniSONE (DELTASONE) 10 MG tablet Take 10 mg by mouth daily. 01/15/22  Yes [provider]  triamcinolone cream (KENALOG) 0.1 % once daily as needed 12/26/18  Yes [provider]  Tuberculin-Allergy Syringes (VANISHPOINT TUBERCULIN SYRINGE) 25G X 1" 1 ML MISC See admin instructions. 04/02/22  Yes [provider]  olmesartan (BENICAR) 20 MG tablet Take 20 mg by mouth daily.    [provider]  olmesartan-hydrochlorothiazide (BENICAR HCT) 20-12.5 MG per tablet Take 1 tablet by mouth daily.    [provider]  fluticasone (FLONASE) 50 MCG/ACT nasal spray Place 2 sprays into both nostrils daily. 01/16/15 12/09/15  Lutricia Feil, PA-C    Family History Family History  Problem Relation Age of Onset   Stroke Mother    Hypertension Father    Stroke Father     Social History Social History   Tobacco Use   Smoking status: Never   Smokeless tobacco: Never  Substance Use Topics   Alcohol use: Yes    Comment: socially   Drug use: No     Allergies   Patient has no known allergies.   Review of Systems Review of Systems   Physical Exam Triage Vital Signs ED Triage Vitals [06/14/22 1130]  Enc Vitals Group     BP      Pulse Rate 70  Resp 16     Temp 97.9 F (36.6 C)     Temp src      SpO2 97 %     Weight      Height      Head Circumference      Peak Flow      Pain Score 5     Pain Loc      Pain Edu?      Excl. in GC?    No data found.  Updated Vital Signs Pulse 70   Temp 97.9 F (36.6 C)   Resp 16   SpO2 97%   Visual Acuity Right Eye Distance:   Left Eye Distance:   Bilateral Distance:    Right Eye Near:   Left Eye Near:    Bilateral Near:     Physical Exam Vitals reviewed.  Constitutional:      Appearance: Normal appearance.  Musculoskeletal:       Feet:  Skin:    General: Skin is warm and dry.   Neurological:     General: No focal deficit present.     Mental Status: He is alert and oriented to person, place, and time.  Psychiatric:        Mood and Affect: Mood normal.        Behavior: Behavior normal.     UC Treatments / Results  Labs (all labs ordered are listed, but only abnormal results are displayed) Labs Reviewed - No data to display  EKG   Radiology No results found.  Procedures Procedures (including critical care time)  Medications Ordered in UC Medications - No data to display  Initial Impression / Assessment and Plan / UC Course  I have reviewed the triage vital signs and the nursing notes.  Pertinent labs & imaging results that were available during my care of the patient were reviewed by me and considered in my medical decision making (see chart for details).   Gout flare vs other acute inflammatory disorder. Not likely bunion as this is acute onset. Will treat with course of prednisone.   Final Clinical Impressions(s) / UC Diagnoses   Final diagnoses:  None   Discharge Instructions   None    ED Prescriptions   None    PDMP not reviewed this encounter.   Charma Igo, [place] 06/14/22 1158

## 2022-06-14 NOTE — ED Triage Notes (Signed)
Pt. Presents to UC w/ c/o right big toe pain and swelling to the site that started 4 days ago.

## 2022-06-23 ENCOUNTER — Other Ambulatory Visit (HOSPITAL_COMMUNITY): Payer: Self-pay | Admitting: Neurology

## 2022-06-23 DIAGNOSIS — G3184 Mild cognitive impairment, so stated: Secondary | ICD-10-CM

## 2022-06-26 ENCOUNTER — Other Ambulatory Visit: Payer: Medicare Other

## 2022-06-26 ENCOUNTER — Ambulatory Visit (HOSPITAL_COMMUNITY)
Admission: RE | Admit: 2022-06-26 | Discharge: 2022-06-26 | Disposition: A | Discharge status: Home / Self Care | Payer: Medicare Other | Source: Ambulatory Visit | Attending: Neurology | Admitting: Neurology

## 2022-06-26 DIAGNOSIS — G3184 Mild cognitive impairment, so stated: Secondary | ICD-10-CM | POA: Diagnosis present

## 2023-08-19 ENCOUNTER — Telehealth (INDEPENDENT_AMBULATORY_CARE_PROVIDER_SITE_OTHER): Payer: Self-pay | Admitting: Otolaryngology

## 2023-08-19 ENCOUNTER — Encounter (INDEPENDENT_AMBULATORY_CARE_PROVIDER_SITE_OTHER): Payer: Self-pay

## 2023-08-19 NOTE — Telephone Encounter (Signed)
LVM for patient to call office to schedule appt w/ Dr. Allena Katz for an [facility] consult.  Per Dr. Eliane Decree & [employer]'s email request 08/18/23.

## 2023-08-20 ENCOUNTER — Telehealth (INDEPENDENT_AMBULATORY_CARE_PROVIDER_SITE_OTHER): Payer: Self-pay | Admitting: Otolaryngology

## 2023-08-20 NOTE — Telephone Encounter (Signed)
Confirmed appt & location [phone] [id number]

## 2023-08-23 ENCOUNTER — Ambulatory Visit (INDEPENDENT_AMBULATORY_CARE_PROVIDER_SITE_OTHER): Payer: Medicare Other | Admitting: Otolaryngology

## 2023-08-23 ENCOUNTER — Encounter (INDEPENDENT_AMBULATORY_CARE_PROVIDER_SITE_OTHER): Payer: Self-pay

## 2023-08-23 VITALS — BP 166/82 | HR 62 | Ht 68.0 in | Wt 200.0 lb

## 2023-08-23 DIAGNOSIS — Z91198 Patient's noncompliance with other medical treatment and regimen for other reason: Secondary | ICD-10-CM

## 2023-08-23 DIAGNOSIS — G4733 Obstructive sleep apnea (adult) (pediatric): Secondary | ICD-10-CM | POA: Diagnosis not present

## 2023-08-23 DIAGNOSIS — Z789 Other specified health status: Secondary | ICD-10-CM

## 2023-08-23 NOTE — Progress Notes (Signed)
Dear Dr. Burnett Sheng, Here is my assessment for our mutual patient, Nicholas Arellano. Thank you for allowing me the opportunity to care for your patient. Please do not hesitate to contact me should you have any other questions. Sincerely, Dr. Jovita Kussmaul  Otolaryngology Clinic Note Referring provider: Dr. Burnett Sheng HPI:  Nicholas Arellano is a 78 y.o. male kindly referred by Dr. Burnett Sheng for evaluation of obstructive sleep apnea.  Initial visit (08/2023): Patient reports: he reports that he had a sleep study about 2 years ago and was diagnosed with OSA. He has been following with Dr. Sherryll Burger and using a CPAP for at least 2 years but he has a hard time with the CPAP and has not been using it for months. He was using the full face mask but could not tolerate it and was thus using the nasal pillow now. Despite using that, he reports that when he falls asleep, and it bothers him because he keeps waking up with it - hard time tolerating the mask and the pressures and takes it off. Interested in inspire for that reason.  No dysphonia, dysphagia, otherwise airway trouble or surgery of the airways. Has had a tonsillectomy. No chest surgery  No insomnia. No RLS  He does not remember the severity of his sleep apnea was but Dr. Sherryll Burger his neurologist who ordered it. He is scheduled to see him in about 2 months.   H&N Surgery: tonsillectomy (50s), Septoplasty Personal or FHx of bleeding dz or anesthesia difficulty: no   GLP-1: no AP/AC: ASA 81  Tobacco: never. Alcohol: no significant use history. Lives in [place], [place]  PMHx: Psoriasis, Lumbar Stenosis, HTN, CKD, Mild cognitive impairment, Proteinuria  Independent Review of Additional Tests or Records:  Dr. Sherryll Burger [facility]) - 06/16/2022: noted mild cognitive impariment, and suspected OSA with snoring. Dx: Suspected OSA; Rx: Sleep study, ref ENT CBC and Renal Function panel 06/23/2023: generally wnl, WBC 3.7 and Cr 1.24, GFR 60 PMH/Meds/All/SocHx/FamHx/ROS:   Past  Medical History:  Diagnosis Date   Gout    Hypertension      Past Surgical History:  Procedure Laterality Date   TONSILLECTOMY     VASECTOMY      Family History  Problem Relation Age of Onset   Stroke Mother    Hypertension Father    Stroke Father      Social Connections: Not on file      Current Outpatient Medications:    cyanocobalamin (VITAMIN B12) 1000 MCG/ML injection, Inject into the muscle., Disp: , Rfl:    hydrochlorothiazide (HYDRODIURIL) 12.5 MG tablet, Take by mouth., Disp: , Rfl:    olmesartan (BENICAR) 20 MG tablet, Take 20 mg by mouth daily., Disp: , Rfl:    triamcinolone cream (KENALOG) 0.1 %, once daily as needed, Disp: , Rfl:    Tuberculin-Allergy Syringes (VANISHPOINT TUBERCULIN SYRINGE) 25G X 1" 1 ML MISC, See admin instructions., Disp: , Rfl:    ATORVASTATIN CALCIUM PO, Take by mouth., Disp: , Rfl:    cephALEXin (KEFLEX) 500 MG capsule, Take 500 mg by mouth 3 (three) times daily. (Patient not taking: Reported on 08/23/2023), Disp: , Rfl:    gabapentin (NEURONTIN) 300 MG capsule, TAKE 1 TABLET BY MOUTH IN THE MORNING, 1 TABLET IN THE AFTERNOON AND 2 TABLETS IN THE EVENING TIME (Patient not taking: Reported on 08/23/2023), Disp: , Rfl:    olmesartan-hydrochlorothiazide (BENICAR HCT) 20-12.5 MG per tablet, Take 1 tablet by mouth daily., Disp: , Rfl:    Physical Exam:   BP Marland Kitchen)  166/82 (BP Location: Right Arm, Patient Position: Sitting, Cuff Size: Normal)   Pulse 62   Ht 5\' 8"  (1.727 m)   Wt 200 lb (90.7 kg)   SpO2 97%   BMI 30.41 kg/m   Salient findings:  CN II-XII intact  Bilateral EAC clear and TM intact with well pneumatized middle ear spaces Anterior rhinoscopy: Septum with anterior, clean perforation; bilateral inferior turbinates without significant hypertrophy No lesions of oral cavity/oropharynx; dentition fair; Friedman tongue 3; tonsils absent, palate symmetric elevation; BMI 30.4 No obviously palpable neck masses/lymphadenopathy/thyromegaly or  scars No respiratory distress or stridor; TFL was indicated to better evaluate the proximal airway, given the patient's history and exam findings and rule out any obvious static cause of airway stenosis, and is detailed below.   Seprately Identifiable Procedures:  Procedure Note Pre-procedure diagnosis: Obstructive sleep apnea, rule out anatomic airway lesion Post-procedure diagnosis: Same Procedure: Transnasal Fiberoptic Laryngoscopy, CPT 31575 - Mod 25 Indication: see above Complications: None apparent EBL: 0 mL  The procedure was undertaken to further evaluate the patient's complaint of OSA and to rule out anatomic airway lesion before proceeding with DISE, with mirror exam inadequate for appropriate examination due to gag reflex and poor patient tolerance  Procedure:  Patient was identified as correct patient. Verbal consent was obtained. The nose was sprayed with oxymetazoline and 4% lidocaine. The The flexible laryngoscope was passed through the nose to view the nasal cavity, pharynx (oropharynx, hypopharynx) and larynx.  The larynx was examined at rest and during multiple phonatory tasks. Documentation was obtained and reviewed with patient. The scope was removed. The patient tolerated the procedure well.  Findings: The nasal cavity and nasopharynx did not reveal any masses or lesions, mucosa appeared to be without obvious lesions except for anterior septal perforation, clean. The tongue base, pharyngeal walls, piriform sinuses, vallecula, epiglottis and postcricoid region are normal in appearance; Muller maneuver negative; The visualized portion of the subglottis and proximal trachea is widely patent. The vocal folds are mobile bilaterally. There are no lesions on the free edge of the vocal folds nor elsewhere in the larynx worrisome for malignancy.      Electronically signed by: Read Drivers, MD 08/23/2023 10:06 AM   Impression & Plans:  [name] is a 78 y.o. male with:  1.  OSA (obstructive sleep apnea)   2. Intolerance of continuous positive airway pressure (CPAP) ventilation    Noted OSA and has trialed multiple masks and CPAP with titration but unable to tolerate it. Has not been using it for months. BMI 30.4. Last home sleep study was about 2 years ago but do not have results. TFL reassuring without airway lesions.  We discussed options including continued CPAP v/s Inspire. He is likely a candidate for it depending on his sleep study results, and we will obtain those from Dr. Margaretmary Eddy office. We discussed R/B/A for inspire including persistent OSA, lack of improvement, pulmonary complications, hardware infection among others. He understands this. He would like to proceed with inspire and DISE if eligible.   See below regarding exact medications prescribed this encounter including dosages and route: No orders of the defined types were placed in this encounter.     Thank you for allowing me the opportunity to care for your patient. Please do not hesitate to contact me should you have any other questions.  Sincerely, Jovita Kussmaul, MD Otolaryngologist (ENT), [facility] Phone: [phone] Fax: [phone]  08/23/2023, 10:06 AM   MDM:  Level 4 -  60454 Complexity/Problems addressed: mod - chronic problem, worsening due to lack of treatment Data complexity: mod - independent review of notes and multiple labs - Morbidity: low/unclear  - Prescription Drug prescribed or managed: no

## 2023-09-27 ENCOUNTER — Telehealth (INDEPENDENT_AMBULATORY_CARE_PROVIDER_SITE_OTHER): Payer: Self-pay | Admitting: Otolaryngology

## 2023-09-27 DIAGNOSIS — Z789 Other specified health status: Secondary | ICD-10-CM

## 2023-09-27 DIAGNOSIS — G4733 Obstructive sleep apnea (adult) (pediatric): Secondary | ICD-10-CM

## 2023-09-27 NOTE — Telephone Encounter (Signed)
 Sleep study reviewed:  AHI 26, BMI 30.  Again discussed with him re: R/B/A and he'd like to proceed with DISE and if approved by insurance, inspire. Will schedule for DISE

## 2023-10-06 ENCOUNTER — Encounter (HOSPITAL_COMMUNITY): Payer: Self-pay

## 2023-10-07 ENCOUNTER — Encounter (HOSPITAL_COMMUNITY): Payer: Self-pay | Admitting: General Practice

## 2023-10-07 ENCOUNTER — Other Ambulatory Visit: Payer: Self-pay

## 2023-10-07 NOTE — Progress Notes (Signed)
 SDW CALL  Patient was given pre-op instructions over the phone. The opportunity was given for the patient to ask questions. No further questions asked. Patient verbalized understanding of instructions given.   PCP - Jerl Mina Cardiologist - denies  PPM/ICD - denies Device Orders - n/a Rep Notified - n/a  Chest x-ray - denies EKG - DOS Stress Test -  denies  ECHO - denies Cardiac Cath - denies  Sleep Study - OSA+  No DM  Last dose of GLP1 agonist-  n/a GLP1 instructions: n/a  Blood Thinner Instructions: denies Aspirin Instructions: patient takes Aspirin for pain and has not had in a while - unable to state his last dose - patient is aware that he is not to take Aspirin between now and procedure  ERAS Protcol - clears until 1000 PRE-SURGERY Ensure or G2- n/a  COVID TEST- n/a   Anesthesia review: yes - sent on 3/19 - HTN, CKD, OSA  Patient denies shortness of breath, fever, cough and chest pain over the phone call   All instructions explained to the patient, with a verbal understanding of the material. Patient agrees to go over the instructions while at home for a better understanding.    Teach back method use and patient able to confirm day of surgery and arrival time.  Patient also was able to repeat back medication instructions and when to stop drinking clear liquids.  Patient states that his wife will be able to drive him home and stay with him for 24 hours after the procedure.

## 2023-10-11 ENCOUNTER — Other Ambulatory Visit: Payer: Self-pay

## 2023-10-11 ENCOUNTER — Ambulatory Visit (HOSPITAL_COMMUNITY): Payer: Self-pay | Admitting: Physician Assistant

## 2023-10-11 ENCOUNTER — Ambulatory Visit (HOSPITAL_COMMUNITY)
Admission: RE | Admit: 2023-10-11 | Discharge: 2023-10-11 | Disposition: A | Discharge status: Home / Self Care | Attending: Otolaryngology | Admitting: Otolaryngology

## 2023-10-11 ENCOUNTER — Encounter (HOSPITAL_COMMUNITY)
Admission: RE | Disposition: A | Discharge status: Home / Self Care | Payer: Self-pay | Source: Home / Self Care | Attending: Otolaryngology

## 2023-10-11 ENCOUNTER — Encounter (HOSPITAL_COMMUNITY): Payer: Self-pay | Admitting: *Deleted

## 2023-10-11 DIAGNOSIS — Z87442 Personal history of urinary calculi: Secondary | ICD-10-CM | POA: Diagnosis not present

## 2023-10-11 DIAGNOSIS — N189 Chronic kidney disease, unspecified: Secondary | ICD-10-CM | POA: Diagnosis not present

## 2023-10-11 DIAGNOSIS — Z91198 Patient's noncompliance with other medical treatment and regimen for other reason: Secondary | ICD-10-CM | POA: Diagnosis not present

## 2023-10-11 DIAGNOSIS — N1831 Chronic kidney disease, stage 3a: Secondary | ICD-10-CM | POA: Diagnosis not present

## 2023-10-11 DIAGNOSIS — G4733 Obstructive sleep apnea (adult) (pediatric): Secondary | ICD-10-CM

## 2023-10-11 DIAGNOSIS — I129 Hypertensive chronic kidney disease with stage 1 through stage 4 chronic kidney disease, or unspecified chronic kidney disease: Secondary | ICD-10-CM | POA: Insufficient documentation

## 2023-10-11 DIAGNOSIS — Z789 Other specified health status: Secondary | ICD-10-CM

## 2023-10-11 HISTORY — DX: Chronic kidney disease, unspecified: N18.9

## 2023-10-11 HISTORY — DX: Personal history of urinary calculi: Z87.442

## 2023-10-11 HISTORY — PX: DRUG INDUCED ENDOSCOPY: SHX6808

## 2023-10-11 HISTORY — DX: Sleep apnea, unspecified: G47.30

## 2023-10-11 LAB — CBC
HCT: 43.6 % (ref 39.0–52.0)
Hemoglobin: 14.7 g/dL (ref 13.0–17.0)
MCH: 31.5 pg (ref 26.0–34.0)
MCHC: 33.7 g/dL (ref 30.0–36.0)
MCV: 93.4 fL (ref 80.0–100.0)
Platelets: 140 10*3/uL — ABNORMAL LOW (ref 150–400)
RBC: 4.67 MIL/uL (ref 4.22–5.81)
RDW: 12.3 % (ref 11.5–15.5)
WBC: 4.7 10*3/uL (ref 4.0–10.5)
nRBC: 0 % (ref 0.0–0.2)

## 2023-10-11 LAB — BASIC METABOLIC PANEL
Anion gap: 10 (ref 5–15)
BUN: 20 mg/dL (ref 8–23)
CO2: 16 mmol/L — ABNORMAL LOW (ref 22–32)
Calcium: 8.8 mg/dL — ABNORMAL LOW (ref 8.9–10.3)
Chloride: 110 mmol/L (ref 98–111)
Creatinine, Ser: 1.33 mg/dL — ABNORMAL HIGH (ref 0.61–1.24)
GFR, Estimated: 55 mL/min — ABNORMAL LOW (ref >60)
Glucose, Bld: 88 mg/dL (ref 70–99)
Potassium: 4.6 mmol/L (ref 3.5–5.1)
Sodium: 136 mmol/L (ref 135–145)

## 2023-10-11 SURGERY — DRUG INDUCED SLEEP ENDOSCOPY
Anesthesia: Monitor Anesthesia Care

## 2023-10-11 MED ORDER — LACTATED RINGERS IV SOLN
INTRAVENOUS | Status: DC
Start: 2023-10-11 — End: 2023-10-11

## 2023-10-11 MED ORDER — CHLORHEXIDINE GLUCONATE 0.12 % MT SOLN
15.0000 mL | Freq: Once | OROMUCOSAL | Status: AC
  Administered 2023-10-11: 15 mL via OROMUCOSAL
  Filled 2023-10-11: qty 15

## 2023-10-11 MED ORDER — PROPOFOL 1000 MG/100ML IV EMUL
INTRAVENOUS | Status: AC
  Filled 2023-10-11: qty 100

## 2023-10-11 MED ORDER — ORAL CARE MOUTH RINSE: 15.0000 mL | Freq: Once | OROMUCOSAL | Status: AC

## 2023-10-11 MED ORDER — LIDOCAINE-EPINEPHRINE 1 %-1:100000 IJ SOLN
INTRAMUSCULAR | Status: AC
  Filled 2023-10-11: qty 1

## 2023-10-11 MED ORDER — LIDOCAINE 2% (20 MG/ML) 5 ML SYRINGE
INTRAMUSCULAR | Status: DC | PRN
  Administered 2023-10-11: 40 mg via INTRAVENOUS

## 2023-10-11 MED ORDER — LIDOCAINE 2% (20 MG/ML) 5 ML SYRINGE
INTRAMUSCULAR | Status: AC
  Filled 2023-10-11: qty 5

## 2023-10-11 MED ORDER — PROPOFOL 500 MG/50ML IV EMUL
INTRAVENOUS | Status: DC | PRN
  Administered 2023-10-11: 100 ug/kg/min via INTRAVENOUS

## 2023-10-11 MED ORDER — LIDOCAINE 2% (20 MG/ML) 5 ML SYRINGE
INTRAMUSCULAR | Status: AC
Start: 2023-10-11 — End: ?
  Filled 2023-10-11: qty 5

## 2023-10-11 MED ORDER — OXYMETAZOLINE HCL 0.05 % NA SOLN
NASAL | Status: DC | PRN
  Administered 2023-10-11: 1 via TOPICAL

## 2023-10-11 MED ORDER — PROPOFOL 10 MG/ML IV BOLUS
INTRAVENOUS | Status: DC | PRN
  Administered 2023-10-11: 10 mg via INTRAVENOUS

## 2023-10-11 MED ORDER — OXYMETAZOLINE HCL 0.05 % NA SOLN
NASAL | Status: AC
  Filled 2023-10-11: qty 30

## 2023-10-11 SURGICAL SUPPLY — 6 items
ANTIFOG SOL W/FOAM PAD STRL (MISCELLANEOUS) ×1 IMPLANT
BRONCHOSCOPE PED SLIM DISP (MISCELLANEOUS) IMPLANT
CNTNR URN SCR LID CUP LEK RST (MISCELLANEOUS) IMPLANT
PATTIES SURGICAL .5X1.5 (GAUZE/BANDAGES/DRESSINGS) IMPLANT
SOL ANTI FOG 6CC (MISCELLANEOUS) ×1 IMPLANT
SOLUTION ANTFG W/FOAM PAD STRL (MISCELLANEOUS) IMPLANT

## 2023-10-11 NOTE — Anesthesia Preprocedure Evaluation (Signed)
 Anesthesia Evaluation  Patient identified by MRN, date of birth, ID band Patient awake    Reviewed: NPO status , Patient's Chart, lab work & pertinent test results  History of Anesthesia Complications Negative for: history of anesthetic complications  Airway Mallampati: III  TM Distance: >3 FB Neck ROM: Full    Dental  (+) Dental Advisory Given   Pulmonary sleep apnea    breath sounds clear to auscultation       Cardiovascular hypertension, Pt. on medications  Rhythm:Regular     Neuro/Psych  Neuromuscular disease    GI/Hepatic   Endo/Other  negative endocrine ROS    Renal/GU Renal InsufficiencyRenal diseaseLab Results      Component                Value               Date                      NA                       136                 10/11/2023                K                        4.6                 10/11/2023                CO2                      16 (L)              10/11/2023                GLUCOSE                  88                  10/11/2023                BUN                      20                  10/11/2023                CREATININE               1.33 (H)            10/11/2023                CALCIUM                  8.8 (L)             10/11/2023                GFRNONAA                 55 (L)              10/11/2023                Musculoskeletal negative musculoskeletal ROS (+)    Abdominal   Peds  Hematology  negative hematology ROS (+) Lab Results      Component                Value               Date                      WBC                      4.7                 10/11/2023                HGB                      14.7                10/11/2023                HCT                      43.6                10/11/2023                MCV                      93.4                10/11/2023                PLT                      140 (L)             10/11/2023              Anesthesia Other  Findings   Reproductive/Obstetrics                             Anesthesia Physical Anesthesia Plan  ASA: 3  Anesthesia Plan: MAC   Post-op Pain Management: Minimal or no pain anticipated   Induction: Intravenous  PONV Risk Score and Plan: 1 and Treatment may vary due to age or medical condition  Airway Management Planned: Nasal Cannula, Natural Airway and Simple Face Mask  Additional Equipment: None  Intra-op Plan:   Post-operative Plan:   Informed Consent: I have reviewed the patients History and Physical, chart, labs and discussed the procedure including the risks, benefits and alternatives for the proposed anesthesia with the patient or authorized representative who has indicated his/her understanding and acceptance.     Dental advisory given  Plan Discussed with: CRNA  Anesthesia Plan Comments:        Anesthesia Quick Evaluation

## 2023-10-11 NOTE — Op Note (Signed)
 Otolaryngology Operative note  Nicholas Arellano Date/Time of Admission: 10/11/2023 10:34 AM  CSN: [id number];MRN:[id number]  DOB: 1945-09-02 Age: 78 y.o. Location: [facility] OR    Pre-Op Diagnosis: OBSTRUCTIVE SLEEP APNEA INTOLERANCE OF CONTINUOUS POSITIVE AIRWAY PRESSURE VENTILATION   Post-Op Diagnosis: Same   Procedure: Procedure(s): DRUG INDUCED SLEEP ENDOSCOPY USING Sabra Heck- CPT [phone]  Surgeon: Jovita Kussmaul, MD  Anesthesia type:  MAC  Anesthesiologist: Anesthesiologist: Val Eagle, MD CRNA: Einar Grad, CRNA   Staff: Circulator: Hermelinda Dellen, RN; Despina Arias, RN Scrub Person: Coralee North T Vendor Representative : Dora Sims  EBL: minimal  Drains: None  Post-op disposition and condition: PACU, hemodynamically stable  Findings: There was no evidence of complete concentric palatal obstruction and patient is a candidate anatomically for hypoglossal nerve stimulation therapy.    Complications: None apparent  Indications and consent:  Nicholas Arellano is a 78 y.o. male with obstructive sleep apnea with intolerance of continuous positive airway pressure. As such, with a BMI of 31.17, patient's options were discussed including Hypoglossal nerve stimulator placement. Risks/benefits/alternatives for each option were discussed. Patient expressed understanding, and despite these risks, consented and decided to proceed with drug induced sleep endoscopy to determine stimulator placement candidacy. Informed consent was signed before proceeding.  Procedure: The patient was brought to the endoscopy room and was anesthetized via the standard drug-induced sleep endoscopy protocol using propofol pump. The room lights were dimmed. The propofol infusion rate was started at 50 mcg and gradually increased at which point, conditions that mimic sleep were gradually observed.   With the patient not responsive to verbal commands, but still with spontaneous  respiration, sleep disordered breathing events were clearly observed including snoring   Under these conditions, the flexible endoscope was inserted to examine both sides of the nose as well as the pharynx and larynx.   The VOTE score at baseline was partial AP collapse velopharynx (~50-75%), lateral OP collapse and tongue base AP collapse.  With simulated jaw thrust, the hypopharyngeal obstruction and secondarily the palatal collapse improved.   In summary, there was no evidence of complete concentric palatal obstruction and patient is a candidate anatomically for hypoglossal nerve stimulation therapy.   The anesthesia was then weaned and care transferred to anesthesia who transported patient to PACU in stable condition.   I was present for and performed the entire procedure.

## 2023-10-11 NOTE — Discharge Instructions (Signed)
 You are a candidate for Inspire surgery. Our surgery schedulers will call you if/when the insurance approves the surgery. Please call us at [phone] if you have not heard back from [place] in 2 weeks.

## 2023-10-11 NOTE — Transfer of Care (Signed)
 Immediate Anesthesia Transfer of Care Note  Patient: Nicholas Arellano  Procedure(s) Performed: DRUG INDUCED SLEEP ENDOSCOPY  Patient Location: PACU  Anesthesia Type:MAC  Level of Consciousness: awake and alert   Airway & Oxygen Therapy: Patient Spontanous Breathing  Post-op Assessment: Report given to RN and Post -op Vital signs reviewed and stable  Post vital signs: Reviewed and stable  Last Vitals:  Vitals Value Taken Time  BP 159/74 10/11/23 1327  Temp    Pulse 59 10/11/23 1329  Resp 13 10/11/23 1329  SpO2 95 % 10/11/23 1329  Vitals shown include unfiled device data.  Last Pain:  Vitals:   10/11/23 1104  TempSrc:   PainSc: 0-No pain      Patients Stated Pain Goal: 0 (10/11/23 1104)  Complications: No notable events documented.

## 2023-10-11 NOTE — H&P (Signed)
 Pre-Operative H&P - Day Of Surgery Patient Name: Nicholas Arellano Date:   10/11/2023  HPI: Nicholas Arellano is a 78 y.o. male who presents today for operative treatment of obstructive sleep apnea with CPAP intolerance. Patient denies recent significant changes to health or significant new medications or physiologic change in condition which would immediately impact plans. No new types of therapy has been initiated that would change the plan or the appropriateness of the plan.   ROS:  A complete review of systems was obtained and is otherwise negative.   PMH:  Past Medical History:  Diagnosis Date   Chronic kidney disease    Gout    History of kidney stones    in college   Hypertension    Sleep apnea    CPAP    PSH:  Past Surgical History:  Procedure Laterality Date   COLONOSCOPY     TONSILLECTOMY     VASECTOMY      MEDS:   Current Facility-Administered Medications:    lactated ringers infusion, , Intravenous, Continuous, Val Eagle, MD  ALLERGIES: Shellfish allergy  EXAM: Vitals: BP (!) 186/92 (BP Location: Right Arm) Comment: RN Notified  Pulse 62   Temp (!) 97.5 F (36.4 C) (Oral)   Resp 18   Ht 5\' 8"  (1.727 m)   Wt 93 kg   SpO2 98%   BMI 31.17 kg/m   General Awake, at baseline alertness.   HEENT No scleral icterus or conjunctival hemorrhage. Globe position appears normal. External ears  normal. Nose patent without rhinorrhea. No lymphadenopathy. No thyromegaly  Cardiovascular No cyanosis.  Pulmonary No audible stridor. Breathing easily with no labor.  Neuro Symmetric facial movement.   Psychiatry Appropriate affect and mood.  Skin No scars or lesions on face or neck.  Extermities Moves all extremities with normal range of motion.   Other Findings None.   Assessment & Plan: Nicholas Arellano has diagnoses of obstructive sleep apnea and cpap intolerance and will go to the OR today for drug induced sleep endoscopy. Informed consent was obtained and available in EMR today.  All questions have been answered, and risks/benefits/alternatives of procedure as noted in the consent were discussed in a quiet area. Questions were invited and answered. The patient expressed understanding, provided consent and wished to proceed despite risks.  Read Drivers 10/11/2023 12:37 PM

## 2023-10-12 ENCOUNTER — Encounter (HOSPITAL_COMMUNITY): Payer: Self-pay | Admitting: Otolaryngology

## 2023-10-12 ENCOUNTER — Telehealth (INDEPENDENT_AMBULATORY_CARE_PROVIDER_SITE_OTHER): Payer: Self-pay | Admitting: Otolaryngology

## 2023-10-12 DIAGNOSIS — Z789 Other specified health status: Secondary | ICD-10-CM

## 2023-10-12 DIAGNOSIS — G4733 Obstructive sleep apnea (adult) (pediatric): Secondary | ICD-10-CM

## 2023-10-12 NOTE — Telephone Encounter (Signed)
-----   Message from [place] P sent at 10/12/2023  9:04 AM EDT ----- Regarding: INSPIRE Can you put in an order for surgery? Thanks

## 2023-10-12 NOTE — Telephone Encounter (Signed)
 A user error has taken place: encounter opened in error, closed for administrative reasons.

## 2023-10-14 NOTE — Anesthesia Postprocedure Evaluation (Signed)
 Anesthesia Post Note  Patient: Nicholas Arellano  Procedure(s) Performed: DRUG INDUCED SLEEP ENDOSCOPY     Patient location during evaluation: PACU Anesthesia Type: MAC Level of consciousness: awake and alert Pain management: pain level controlled Vital Signs Assessment: post-procedure vital signs reviewed and stable Respiratory status: spontaneous breathing, nonlabored ventilation and respiratory function stable Cardiovascular status: stable and blood pressure returned to baseline Postop Assessment: no apparent nausea or vomiting Anesthetic complications: no   No notable events documented.  Last Vitals:  Vitals:   10/11/23 1338 10/11/23 1345  BP: (!) 173/90 (!) 182/84  Pulse: (!) 54 (!) 57  Resp: (!) 9 20  Temp:  36.6 C  SpO2: 96% 96%    Last Pain:  Vitals:   10/11/23 1327  TempSrc:   PainSc: 0-No pain                 Chereese Cilento

## 2023-12-20 ENCOUNTER — Encounter (HOSPITAL_BASED_OUTPATIENT_CLINIC_OR_DEPARTMENT_OTHER): Payer: Self-pay

## 2023-12-20 ENCOUNTER — Other Ambulatory Visit: Payer: Self-pay

## 2023-12-21 ENCOUNTER — Ambulatory Visit (INDEPENDENT_AMBULATORY_CARE_PROVIDER_SITE_OTHER): Admitting: Otolaryngology

## 2023-12-21 ENCOUNTER — Encounter (INDEPENDENT_AMBULATORY_CARE_PROVIDER_SITE_OTHER): Payer: Self-pay | Admitting: Otolaryngology

## 2023-12-21 DIAGNOSIS — Z789 Other specified health status: Secondary | ICD-10-CM

## 2023-12-21 DIAGNOSIS — G4733 Obstructive sleep apnea (adult) (pediatric): Secondary | ICD-10-CM

## 2023-12-21 NOTE — Progress Notes (Signed)
 ENT pre-op phone visit: Confirmed patient information including name and DOB. Discussed with patient regarding surgery expectations and answered questions. He is not on any ASA so instructed to avoid that. Also discussed NPO guideline. He is ready to proceed and appreciated the call  Nicholas Aloe, MD [facility]

## 2023-12-26 NOTE — Anesthesia Preprocedure Evaluation (Signed)
 Anesthesia Evaluation  Patient identified by MRN, date of birth, ID band Patient awake    Reviewed: Allergy & Precautions, NPO status , Patient's Chart, lab work & pertinent test results  History of Anesthesia Complications Negative for: history of anesthetic complications  Airway Mallampati: III  TM Distance: >3 FB Neck ROM: Full    Dental  (+) Dental Advisory Given   Pulmonary sleep apnea (no longer uses his CPAP)    breath sounds clear to auscultation       Cardiovascular hypertension, Pt. on medications (-) angina  Rhythm:Regular Rate:Normal     Neuro/Psych negative neurological ROS     GI/Hepatic negative GI ROS, Neg liver ROS,,,  Endo/Other  BMI 31  Renal/GU Renal InsufficiencyRenal disease     Musculoskeletal   Abdominal   Peds  Hematology negative hematology ROS (+)   Anesthesia Other Findings   Reproductive/Obstetrics                             Anesthesia Physical Anesthesia Plan  ASA: 3  Anesthesia Plan: General   Post-op Pain Management: Tylenol  PO (pre-op)*   Induction: Intravenous  PONV Risk Score and Plan: 2 and Ondansetron and Dexamethasone  Airway Management Planned: Oral ETT  Additional Equipment: None  Intra-op Plan:   Post-operative Plan: Extubation in OR  Informed Consent: I have reviewed the patients History and Physical, chart, labs and discussed the procedure including the risks, benefits and alternatives for the proposed anesthesia with the patient or authorized representative who has indicated his/her understanding and acceptance.     Dental advisory given  Plan Discussed with: CRNA and Surgeon  Anesthesia Plan Comments:         Anesthesia Quick Evaluation

## 2023-12-27 ENCOUNTER — Ambulatory Visit (HOSPITAL_COMMUNITY)

## 2023-12-27 ENCOUNTER — Other Ambulatory Visit: Payer: Self-pay

## 2023-12-27 ENCOUNTER — Ambulatory Visit (HOSPITAL_BASED_OUTPATIENT_CLINIC_OR_DEPARTMENT_OTHER): Admitting: Anesthesiology

## 2023-12-27 ENCOUNTER — Encounter (HOSPITAL_BASED_OUTPATIENT_CLINIC_OR_DEPARTMENT_OTHER)
Admission: RE | Disposition: A | Discharge status: Home / Self Care | Payer: Self-pay | Source: Home / Self Care | Attending: Otolaryngology

## 2023-12-27 ENCOUNTER — Ambulatory Visit (HOSPITAL_BASED_OUTPATIENT_CLINIC_OR_DEPARTMENT_OTHER)
Admission: RE | Admit: 2023-12-27 | Discharge: 2023-12-27 | Disposition: A | Discharge status: Home / Self Care | Attending: Otolaryngology | Admitting: Otolaryngology

## 2023-12-27 ENCOUNTER — Encounter (HOSPITAL_BASED_OUTPATIENT_CLINIC_OR_DEPARTMENT_OTHER): Payer: Self-pay

## 2023-12-27 DIAGNOSIS — I129 Hypertensive chronic kidney disease with stage 1 through stage 4 chronic kidney disease, or unspecified chronic kidney disease: Secondary | ICD-10-CM | POA: Insufficient documentation

## 2023-12-27 DIAGNOSIS — G4733 Obstructive sleep apnea (adult) (pediatric): Secondary | ICD-10-CM

## 2023-12-27 DIAGNOSIS — E66811 Obesity, class 1: Secondary | ICD-10-CM | POA: Insufficient documentation

## 2023-12-27 DIAGNOSIS — N189 Chronic kidney disease, unspecified: Secondary | ICD-10-CM | POA: Insufficient documentation

## 2023-12-27 DIAGNOSIS — Z6831 Body mass index (BMI) 31.0-31.9, adult: Secondary | ICD-10-CM | POA: Diagnosis not present

## 2023-12-27 DIAGNOSIS — N1831 Chronic kidney disease, stage 3a: Secondary | ICD-10-CM

## 2023-12-27 DIAGNOSIS — Z91198 Patient's noncompliance with other medical treatment and regimen for other reason: Secondary | ICD-10-CM | POA: Diagnosis not present

## 2023-12-27 DIAGNOSIS — Z01818 Encounter for other preprocedural examination: Secondary | ICD-10-CM

## 2023-12-27 HISTORY — PX: IMPLANTATION OF HYPOGLOSSAL NERVE STIMULATOR: SHX6827

## 2023-12-27 SURGERY — INSERTION, HYPOGLOSSAL NERVE STIMULATOR
Anesthesia: General | Site: Chest | Laterality: Right

## 2023-12-27 MED ORDER — ONDANSETRON HCL 4 MG/2ML IJ SOLN
INTRAMUSCULAR | Status: AC
  Filled 2023-12-27: qty 6

## 2023-12-27 MED ORDER — OXYCODONE HCL 5 MG PO TABS: 5.0000 mg | ORAL_TABLET | ORAL | 0 refills | Status: AC | PRN

## 2023-12-27 MED ORDER — 0.9 % SODIUM CHLORIDE (POUR BTL) OPTIME
TOPICAL | Status: DC | PRN
  Administered 2023-12-27: 200 mL

## 2023-12-27 MED ORDER — ONDANSETRON HCL 4 MG/2ML IJ SOLN
INTRAMUSCULAR | Status: DC | PRN
  Administered 2023-12-27: 4 mg via INTRAVENOUS

## 2023-12-27 MED ORDER — FENTANYL CITRATE (PF) 100 MCG/2ML IJ SOLN
INTRAMUSCULAR | Status: DC | PRN
  Administered 2023-12-27: 100 ug via INTRAVENOUS

## 2023-12-27 MED ORDER — ACETAMINOPHEN 500 MG PO TABS: 1000.0000 mg | ORAL_TABLET | Freq: Four times a day (QID) | ORAL | 1 refills | Status: AC | PRN

## 2023-12-27 MED ORDER — PROPOFOL 500 MG/50ML IV EMUL
INTRAVENOUS | Status: DC | PRN
Start: 2023-12-27 — End: 2023-12-27
  Administered 2023-12-27 (×2): 75 ug/kg/min via INTRAVENOUS

## 2023-12-27 MED ORDER — EPHEDRINE SULFATE-NACL 50-0.9 MG/10ML-% IV SOSY
PREFILLED_SYRINGE | INTRAVENOUS | Status: DC | PRN
  Administered 2023-12-27 (×2): 10 mg via INTRAVENOUS
  Administered 2023-12-27: 5 mg via INTRAVENOUS

## 2023-12-27 MED ORDER — LIDOCAINE-EPINEPHRINE 1 %-1:100000 IJ SOLN
INTRAMUSCULAR | Status: AC
  Filled 2023-12-27: qty 1

## 2023-12-27 MED ORDER — CEFAZOLIN SODIUM 1 G IJ SOLR
INTRAMUSCULAR | Status: AC
  Filled 2023-12-27: qty 10

## 2023-12-27 MED ORDER — SODIUM CHLORIDE (PF) 0.9 % IJ SOLN
INTRAMUSCULAR | Status: AC
  Filled 2023-12-27: qty 10

## 2023-12-27 MED ORDER — OXYCODONE HCL 5 MG/5ML PO SOLN: 5.0000 mg | Freq: Once | ORAL | Status: DC | PRN

## 2023-12-27 MED ORDER — BACITRACIN ZINC 500 UNIT/GM EX OINT: TOPICAL_OINTMENT | CUTANEOUS | 0 refills | Status: DC

## 2023-12-27 MED ORDER — ALBUMIN HUMAN 5 % IV SOLN
INTRAVENOUS | Status: DC | PRN
Start: 2023-12-27 — End: 2023-12-27

## 2023-12-27 MED ORDER — DEXAMETHASONE SODIUM PHOSPHATE 10 MG/ML IJ SOLN
INTRAMUSCULAR | Status: DC | PRN
  Administered 2023-12-27: 10 mg via INTRAVENOUS

## 2023-12-27 MED ORDER — EPHEDRINE 5 MG/ML INJ
INTRAVENOUS | Status: AC
  Filled 2023-12-27: qty 5

## 2023-12-27 MED ORDER — BACITRACIN ZINC 500 UNIT/GM EX OINT
TOPICAL_OINTMENT | CUTANEOUS | Status: DC | PRN
  Administered 2023-12-27: 1 via TOPICAL

## 2023-12-27 MED ORDER — SUCCINYLCHOLINE CHLORIDE 200 MG/10ML IV SOSY
PREFILLED_SYRINGE | INTRAVENOUS | Status: AC
  Filled 2023-12-27: qty 10

## 2023-12-27 MED ORDER — CEFAZOLIN SODIUM-DEXTROSE 2-3 GM-%(50ML) IV SOLR
INTRAVENOUS | Status: DC | PRN
  Administered 2023-12-27 (×2): 2 g via INTRAVENOUS

## 2023-12-27 MED ORDER — DEXAMETHASONE SODIUM PHOSPHATE 10 MG/ML IJ SOLN
INTRAMUSCULAR | Status: AC
  Filled 2023-12-27: qty 3

## 2023-12-27 MED ORDER — IBUPROFEN 200 MG PO TABS: 400.0000 mg | ORAL_TABLET | Freq: Four times a day (QID) | ORAL | 0 refills | Status: DC | PRN

## 2023-12-27 MED ORDER — ACETAMINOPHEN 500 MG PO TABS
1000.0000 mg | ORAL_TABLET | Freq: Once | ORAL | Status: AC
  Administered 2023-12-27: 1000 mg via ORAL

## 2023-12-27 MED ORDER — SUCCINYLCHOLINE CHLORIDE 200 MG/10ML IV SOSY
PREFILLED_SYRINGE | INTRAVENOUS | Status: DC | PRN
  Administered 2023-12-27: 160 mg via INTRAVENOUS

## 2023-12-27 MED ORDER — HYDROMORPHONE HCL 1 MG/ML IJ SOLN: 0.2500 mg | INTRAMUSCULAR | Status: DC | PRN

## 2023-12-27 MED ORDER — PROPOFOL 10 MG/ML IV BOLUS
INTRAVENOUS | Status: DC | PRN
  Administered 2023-12-27: 200 mg via INTRAVENOUS

## 2023-12-27 MED ORDER — LIDOCAINE 2% (20 MG/ML) 5 ML SYRINGE
INTRAMUSCULAR | Status: AC
  Filled 2023-12-27: qty 10

## 2023-12-27 MED ORDER — PHENYLEPHRINE HCL-NACL 20-0.9 MG/250ML-% IV SOLN
INTRAVENOUS | Status: DC | PRN
  Administered 2023-12-27: 25 ug/min via INTRAVENOUS

## 2023-12-27 MED ORDER — SODIUM CHLORIDE 0.9 % IV SOLN
INTRAVENOUS | Status: AC
  Filled 2023-12-27: qty 10

## 2023-12-27 MED ORDER — SODIUM CHLORIDE 0.9 % IV SOLN
INTRAVENOUS | Status: DC | PRN
  Administered 2023-12-27: 500 mL

## 2023-12-27 MED ORDER — LIDOCAINE-EPINEPHRINE 1 %-1:100000 IJ SOLN
INTRAMUSCULAR | Status: DC | PRN
  Administered 2023-12-27: 10 mL

## 2023-12-27 MED ORDER — BACITRACIN ZINC 500 UNIT/GM EX OINT
TOPICAL_OINTMENT | CUTANEOUS | Status: AC
  Filled 2023-12-27: qty 28.35

## 2023-12-27 MED ORDER — LIDOCAINE 2% (20 MG/ML) 5 ML SYRINGE
INTRAMUSCULAR | Status: DC | PRN
  Administered 2023-12-27: 20 mg via INTRAVENOUS

## 2023-12-27 MED ORDER — LACTATED RINGERS IV SOLN: INTRAVENOUS | Status: DC

## 2023-12-27 MED ORDER — OXYCODONE HCL 5 MG PO TABS: 5.0000 mg | ORAL_TABLET | Freq: Once | ORAL | Status: DC | PRN

## 2023-12-27 MED ORDER — ACETAMINOPHEN 500 MG PO TABS
ORAL_TABLET | ORAL | Status: AC
  Filled 2023-12-27: qty 2

## 2023-12-27 MED ORDER — FENTANYL CITRATE (PF) 100 MCG/2ML IJ SOLN
INTRAMUSCULAR | Status: AC
  Filled 2023-12-27: qty 2

## 2023-12-27 MED ORDER — MIDAZOLAM HCL 2 MG/2ML IJ SOLN: 0.5000 mg | Freq: Once | INTRAMUSCULAR | Status: DC | PRN

## 2023-12-27 SURGICAL SUPPLY — 71 items
BLADE CLIPPER SURG (BLADE) IMPLANT
BLADE SURG 15 STRL LF DISP TIS (BLADE) ×1 IMPLANT
CANISTER SUCT 1200ML W/VALVE (MISCELLANEOUS) ×1 IMPLANT
CHLORAPREP W/TINT 26 (MISCELLANEOUS) ×2 IMPLANT
CLIP TI WIDE RED SMALL 6 (CLIP) IMPLANT
CORD BIPOLAR FORCEPS 12FT (ELECTRODE) ×1 IMPLANT
COVER PROBE CYLINDRICAL 5X96 (MISCELLANEOUS) ×1 IMPLANT
DERMABOND ADVANCED .7 DNX12 (GAUZE/BANDAGES/DRESSINGS) ×1 IMPLANT
DRAPE C-ARM 35X43 STRL (DRAPES) ×1 IMPLANT
DRAPE HEAD BAR (DRAPES) IMPLANT
DRAPE INCISE 23X17 STRL (DRAPES) IMPLANT
DRAPE INCISE IOBAN 23X17 STRL (DRAPES) ×1 IMPLANT
DRAPE MICROSCOPE WILD 40.5X102 (DRAPES) IMPLANT
DRAPE UTILITY XL STRL (DRAPES) ×1 IMPLANT
DRSG TEGADERM 2-3/8X2-3/4 SM (GAUZE/BANDAGES/DRESSINGS) ×2 IMPLANT
DRSG TEGADERM 4X4.75 (GAUZE/BANDAGES/DRESSINGS) ×1 IMPLANT
ELECT COATED BLADE 2.86 ST (ELECTRODE) ×1 IMPLANT
ELECTRODE EMG 18 NIMS (NEUROSURGERY SUPPLIES) ×1 IMPLANT
ELECTRODE REM PT RTRN 9FT ADLT (ELECTROSURGICAL) ×1 IMPLANT
FORCEPS BIPOLAR SPETZLER 8 1.0 (NEUROSURGERY SUPPLIES) ×1 IMPLANT
GAUZE 4X4 16PLY ~~LOC~~+RFID DBL (SPONGE) ×1 IMPLANT
GAUZE SPONGE 4X4 12PLY STRL (GAUZE/BANDAGES/DRESSINGS) ×1 IMPLANT
GENERATOR PULSE INSPIRE (Generator) ×1 IMPLANT
GENERATOR PULSE INSPIRE IV (Generator) ×1 IMPLANT
GLOVE BIO SURGEON STRL SZ 6.5 (GLOVE) ×1 IMPLANT
GLOVE BIO SURGEON STRL SZ7.5 (GLOVE) ×1 IMPLANT
GLOVE BIOGEL PI IND STRL 6.5 (GLOVE) IMPLANT
GLOVE BIOGEL PI IND STRL 7.0 (GLOVE) IMPLANT
GLOVE BIOGEL PI IND STRL 7.5 (GLOVE) IMPLANT
GLOVE SURG SS PI 7.0 STRL IVOR (GLOVE) IMPLANT
GOWN STRL REUS W/ TWL LRG LVL3 (GOWN DISPOSABLE) ×3 IMPLANT
GOWN STRL REUS W/ TWL XL LVL3 (GOWN DISPOSABLE) IMPLANT
IV CATH 18G SAFETY (IV SOLUTION) ×1 IMPLANT
KIT NEURO ACCESSORY W/WRENCH (MISCELLANEOUS) IMPLANT
LEAD SENSING RESP INSPIRE (Lead) ×1 IMPLANT
LEAD SENSING RESP INSPIRE IV (Lead) ×1 IMPLANT
LEAD SLEEP STIM INSPIRE IV/V (Lead) ×1 IMPLANT
LEAD SLEEP STIMULATION INSPIRE (Lead) ×1 IMPLANT
LOOP VASCLR MAXI BLUE 18IN ST (MISCELLANEOUS) ×1 IMPLANT
LOOP VESSEL MINI RED (MISCELLANEOUS) ×1 IMPLANT
LOOPS VASCLR MAXI BLUE 18IN ST (MISCELLANEOUS) ×1 IMPLANT
MARKER SKIN DUAL TIP RULER LAB (MISCELLANEOUS) ×1 IMPLANT
NDL HYPO 25X1 1.5 SAFETY (NEEDLE) ×1 IMPLANT
NEEDLE HYPO 25X1 1.5 SAFETY (NEEDLE) ×1 IMPLANT
NS IRRIG 1000ML POUR BTL (IV SOLUTION) ×1 IMPLANT
PACK BASIN DAY SURGERY FS (CUSTOM PROCEDURE TRAY) ×1 IMPLANT
PACK ENT DAY SURGERY (CUSTOM PROCEDURE TRAY) ×1 IMPLANT
PASSER CATH 36 CODMAN DISP (NEUROSURGERY SUPPLIES) IMPLANT
PASSER CATH 38CM DISP (INSTRUMENTS) IMPLANT
PENCIL SMOKE EVACUATOR (MISCELLANEOUS) ×1 IMPLANT
PROBE NERVE STIMULATOR (NEUROSURGERY SUPPLIES) ×1 IMPLANT
REMOTE CONTROL SLEEP INSPIRE (MISCELLANEOUS) ×1 IMPLANT
SET WALTER ACTIVATION W/DRAPE (SET/KITS/TRAYS/PACK) ×1 IMPLANT
SLEEVE SCD COMPRESS KNEE MED (STOCKING) ×1 IMPLANT
SPONGE INTESTINAL PEANUT (DISPOSABLE) ×1 IMPLANT
SUT MNCRL AB 4-0 PS2 18 (SUTURE) ×1 IMPLANT
SUT MON AB 5-0 PS2 18 (SUTURE) IMPLANT
SUT PLAIN GUT FAST 5-0 (SUTURE) IMPLANT
SUT SILK 2 0 SH (SUTURE) IMPLANT
SUT SILK 2 0 SH CR/8 (SUTURE) ×1 IMPLANT
SUT SILK 3 0 RB1 (SUTURE) IMPLANT
SUT SILK 3 0 REEL (SUTURE) ×1 IMPLANT
SUT VIC AB 3-0 SH 18 (SUTURE) ×1 IMPLANT
SUT VIC AB 3-0 SH 27X BRD (SUTURE) IMPLANT
SUT VIC AB 4-0 PS2 27 (SUTURE) IMPLANT
SUT VIC AB 4-0 SH 18 (SUTURE) ×1 IMPLANT
SUT VICRYL 3-0 CR8 SH (SUTURE) IMPLANT
SUTURE SILK 3-0 RB1 30XBRD (SUTURE) ×2 IMPLANT
SYR 10ML LL (SYRINGE) ×2 IMPLANT
SYR BULB EAR ULCER 3OZ GRN STR (SYRINGE) ×1 IMPLANT
TOWEL GREEN STERILE FF (TOWEL DISPOSABLE) ×2 IMPLANT

## 2023-12-27 NOTE — Anesthesia Postprocedure Evaluation (Signed)
 Anesthesia Post Note  Patient: Nicholas Arellano  Procedure(s) Performed: INSERTION, HYPOGLOSSAL NERVE STIMULATOR (Right: Chest)     Patient location during evaluation: PACU Anesthesia Type: General Level of consciousness: awake and alert, patient cooperative and oriented Pain management: pain level controlled Vital Signs Assessment: post-procedure vital signs reviewed and stable Respiratory status: spontaneous breathing, nonlabored ventilation and respiratory function stable Cardiovascular status: blood pressure returned to baseline and stable Postop Assessment: no apparent nausea or vomiting, able to ambulate and adequate PO intake Anesthetic complications: no   No notable events documented.  Last Vitals:  Vitals:   12/27/23 1315 12/27/23 1328  BP:  (!) 143/78  Pulse: 83 79  Resp: 15 16  Temp:  (!) 36.3 C  SpO2: 91% 96%    Last Pain:  Vitals:   12/27/23 1328  TempSrc: Temporal  PainSc: 0-No pain                 Aqib Lough,E. Daeveon Zweber

## 2023-12-27 NOTE — Anesthesia Procedure Notes (Signed)
 Procedure Name: Intubation Date/Time: 12/27/2023 9:22 AM  Performed by: Steffani Edman, CRNAPre-anesthesia Checklist: Patient identified, Emergency Drugs available, Suction available and Patient being monitored Patient Re-evaluated:Patient Re-evaluated prior to induction Oxygen Delivery Method: Circle System Utilized Preoxygenation: Pre-oxygenation with 100% oxygen Induction Type: IV induction Ventilation: Mask ventilation without difficulty Laryngoscope Size: Mac and 4 Grade View: Grade II Tube type: Oral Number of attempts: 1 Airway Equipment and Method: Stylet and Oral airway Placement Confirmation: ETT inserted through vocal cords under direct vision, positive ETCO2 and breath sounds checked- equal and bilateral Tube secured with: Tape Dental Injury: Teeth and Oropharynx as per pre-operative assessment

## 2023-12-27 NOTE — Transfer of Care (Signed)
 Immediate Anesthesia Transfer of Care Note  Patient: Nicholas Arellano  Procedure(s) Performed: INSERTION, HYPOGLOSSAL NERVE STIMULATOR (Right: Chest)  Patient Location: PACU  Anesthesia Type:General  Level of Consciousness: drowsy  Airway & Oxygen Therapy: Patient Spontanous Breathing and Patient connected to face mask oxygen  Post-op Assessment: Report given to RN and Post -op Vital signs reviewed and stable  Post vital signs: Reviewed and stable  Last Vitals:  Vitals Value Taken Time  BP 156/83 12/27/23 1215  Temp 36.7 C 12/27/23 1214  Pulse 103 12/27/23 1217  Resp 18 12/27/23 1217  SpO2 96 % 12/27/23 1217  Vitals shown include unfiled device data.  Last Pain:  Vitals:   12/27/23 0715  TempSrc: Tympanic  PainSc: 0-No pain      Patients Stated Pain Goal: 6 (12/27/23 0715)  Complications: No notable events documented.

## 2023-12-27 NOTE — Op Note (Signed)
 Otolaryngology Operative note  Winferd Wease Date/Time of Admission: 12/27/2023  6:43 AM  CSN: [id number];MRN:[id number]  DOB: [birth year] Age: 78 y.o. 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   Surgeon: Milon Aloe, MD  Anesthesia type:  General  Anesthesiologist: Anesthesiologist: Jonne Netters, MD CRNA: Lucky Sable, CRNA; Delphine Fiedler H, CRNA; Glorya Larsson, CRNA   Staff: Circulator: Glynn Lasso, RN; Jil Mosses, RN Scrub Person: Angelia Barcelona, Sissy Duff, RN Vendor Representative : Azzie Bollman  Implants: Implant Name Type Inv. Item Serial No. Manufacturer Lot No. LRB No. Used Action  GENERATOR PULSE INSPIRE - [id number] Generator GENERATOR PULSE INSPIRE [id number] [employer]  Right 1 Implanted  LEAD SLEEP STIMULATION INSPIRE - [id number] Lead LEAD SLEEP STIMULATION INSPIRE [id number] [employer]  Right 1 Implanted  LEAD SENSING RESP INSPIRE - [id number] Lead LEAD SENSING RESP INSPIRE [id number] [employer]  Right 1 Implanted    Specimens: None  EBL: 25cc  Drains: None  Post-op disposition and condition: PACU, hemodynamically stable  Findings: Normal neck and chest anatomy with successful implantation of hypoglossal stimulator. Diagnostic evaluation confirmed an appropriate respiration sensing signal as well as activation of the genioglossus nerve, resulting in genioglossal activation and tongue protrusion, confirmed visually.   Complications: None apparent  Indications and consent:  Nicholas Arellano is a 78 y.o. male with history of moderate to severe obstructive sleep apnea with a BMI of  31 and intolerance of continuous positive airway pressure ventilation therapy. Patient has passed the clinical, polysomnographic and endoscopic screening criteria for implantation. The patient's options were discussed, including risks/benefits/alternatives for each option. Patient expressed understanding, and despite these risks, consented and decided to proceed with above procedures. Informed consent was signed before proceeding.   Procedure: The patient was brought to the Operating Room and was anesthetized via general endotracheal anesthesia without complication.  A shoulder roll was placed and the patient was prepped and draped in usual sterile fashion with the head turned to the left. Prior to prepping and draping, electrodes were placed in the genioglossus and hyoglossus muscle and connected to the NIM box for intraoperative nerve monitoring. Intraoperative antibiotics and steroids were administered.   1% lidocaine  with 1:100000 epinephrine  was injected in the planned and marked submandibular and chest incisions.  The patient was then prepped and draped in the standard fashion for this procedure.     A modified sub-mandibular incision was made in the right upper neck halfway between the hyoid bone and the inferior border of the mandible, about 1cm lateral from midline and about 5cm in length. Dissection was carried down through the subcutaneous tissue and platysma. The inferior border of the submandibular gland was identified as well as the digastric tendon. The submandibular gland and the overlying fascia with the marginal mandibular nerve were retracted superiorly and laterally.  The digastric tendon was retracted inferiorly using two vessel loops. Dissection was carried down under the mylohyoid muscle where the hypoglossal nerve was identified in its usual fashion.    The mylohyoid muscle was retracted anteriorly, and the hypoglossal nerve was dissected medially. The lateral branches to retrusor  muscles were identified, and tested intra-operatively using the bipolar NIM stimulator. The superior/posterior branches innervating the hyoglossus muscle were  identified using the NIM stimulator and anatomical cues.  The cuff electrode for the hypoglossal nerve stimulator was placed distally to these branches innervating the genioglossus, transverse, and vertical muscles. C1 branch takeoff was very obtuse, and the main trunk itself of the hypoglossal nerve inclusion branches was very large. We attempted to place the cuff surrounding the C1 and inclusion branches, but due to the tension this would place on the nerve, the C1 branch was not included in the cuff. The stimulation lead was anchored to the digastric tendon using two 3-0 silk sutures. The wound bed was then irrigated with antibiotic irrigation. The lead body slack between the cuff and the anchor was gently tucked deep to the submandibular gland.   A second 5 cm incision was made in the right upper chest over the second intercostal space, approximately 3cm lateral to the sternal margin. Dissection was carried down through the skin and subcutaneous tissue to the fascia of the pectoralis muscle. An inferior pocket for the generator was created deep to the subcutaneous layer and superficial to the fascia of the pectoralis muscle.  The pectoralis major fascia was dissected directly over the second intercostal space with subsequent blunt dissection through the muscle.  The pectoralis major and minor were then retracted to expose the fatty layer just superficial to the external intercostal muscles.  The fatty layer was carefully swept away to expose the external intercostal muscles. A throw-down base knot was placed to the fascia of the external intercostals just lateral to the anterior external membrane using 3-0 silk suture.  The external intercostals were gently dissected, approximately 5 mm lateral to the suture knot and the respiratory sense lead was  advanced with the sensor facing the pleura into the interfascial plane between the external and internal intercostals. The primary anchor was sutured into place with 3-0 silk on the external intercostals.  The secondary anchor was sutured with 3-0 silk to the pectoralis major with some slack between the anchors.   The stimulation lead was then tunneled in a subplatysmal plane with blunt dissection under direct visualization and brought out into the sub-clavicular pocket where both the stimulation lead and the respiratory sensing lead were connected to the implantable pulse generator.   The implantable pulse generator was placed in the subclavicular pocket, ensuring the lead body was deep to the generator and secured with use of air knots to the pectoralis fascia using 2-0 silk sutures.  Diagnostic evaluation confirmed good placement of the stimulation cuff as demonstrated by activation of the genioglossus and transverse and vertical muscles, resulting in tongue protrusion, confirmed visually. Diagnostic evaluation also confirmed good respiratory sensor placement as demonstrated by a sensing waveform with rise and fall associated with patient respirations.   The wound bed was then irrigated with antibiotic irrigation and hemostasis was noted adequate.   The chest wound was then closed in three layers with deep 3-0 and 4-0 vicryl sutures and a 5-0 fast gut followed by bacitracin ointment. The neck was closed in three layers with deep 3-0 and 4-0 vicryl sutures and 5-0 fast gut suture.The patient was then awakened, extubated, and transferred to Recovery Room in stable condition. Post-operative xrays were obtained in the recovery room  Evelina Hippo

## 2023-12-27 NOTE — H&P (Signed)
 Pre-Operative H&P - Day Of Surgery Patient Name: Nicholas Arellano Date:   12/27/2023  HPI: Nicholas Arellano is a 78 y.o. male who presents today for operative treatment of obstructive sleep apnea, intolerance of continuous positive airway pressure ventilation. Patient denies recent significant changes to health or significant new medications or physiologic change in condition which would immediately impact plans. No new types of therapy has been initiated that would change the plan or the appropriateness of the plan.   ROS:  A complete review of systems was obtained and is otherwise negative.   PMH:  Past Medical History:  Diagnosis Date   Chronic kidney disease    Gout    History of kidney stones    in college   Hypertension    Sleep apnea    CPAP    PSH:  Past Surgical History:  Procedure Laterality Date   COLONOSCOPY     DRUG INDUCED ENDOSCOPY N/A 10/11/2023   Procedure: DRUG INDUCED SLEEP ENDOSCOPY;  Surgeon: Evelina Hippo, MD;  Location: [facility] OR;  Service: ENT;  Laterality: N/A;   TONSILLECTOMY     VASECTOMY      MEDS:   Current Facility-Administered Medications:    lactated ringers  infusion, , Intravenous, Continuous, Vernadine Golas, MD, Last Rate: 10 mL/hr at 12/27/23 0813, Continued from Pre-op at 12/27/23 0813  ALLERGIES: Shellfish allergy  EXAM: Vitals: BP (!) 161/80   Pulse 61   Temp 97.7 F (36.5 C) (Tympanic)   Resp (!) 61   Ht 5\' 8"  (1.727 m)   Wt 93.5 kg   SpO2 97%   BMI 31.34 kg/m   General Awake, at baseline alertness.   HEENT No scleral icterus or conjunctival hemorrhage. Globe position appears normal. External ears  normal. Nose patent without rhinorrhea. No lymphadenopathy. No thyromegaly  Cardiovascular No cyanosis.  Pulmonary No audible stridor. Breathing easily with no labor.  Neuro Symmetric facial movement.   Psychiatry Appropriate affect and mood.  Skin No scars or lesions on face or neck.  Extermities Moves all extremities with normal range of  motion.   Other Findings None.   Assessment & Plan: Nicholas Arellano has diagnoses of obstructive sleep apnea, intolerance of continuous positive airway pressure ventilation and will go to the OR today for right hypoglossal nerve stimulator placement. Informed consent was obtained and available in EMR today. All questions have been answered, and risks/benefits/alternatives of procedure as noted in the consent were discussed in a quiet area. Questions were invited and answered. The patient expressed understanding, provided consent and wished to proceed despite risks.  Evelina Hippo 12/27/2023 8:33 AM

## 2023-12-27 NOTE — Discharge Instructions (Addendum)
 Post Anesthesia Guidelines  During recovery from anesthesia  You may feel drowsy and reflexes may be slowed for 24 hours:  -Do not drive, use machinery, appliances, ride bicycles or scooters  -Do not consume alcohol  -Do not make important decisions   Eating and drinking:  -Drink plenty of liquids today  -Avoid fried or spicy foods today  -Return to your regular diet slowly over the next 24 hours  -Please call if unable to keep fluids down  If your throat is sore: -A breathing tube may have been used during your surgery -Drink cool liquids or gargle with warm salt water -If soreness lasts more than a few days, please call us   Surgery Discharge Instructions:  Call clinic or return to ED if you: - develop a fever greater than 101.4 - have shaking chills or are feeling ill - become short of breath - have uncontrollable nausea or vomiting - can't hold down food or liquids or feel as though you are getting dehydrated - have significant leakage or drainage from wound - urine output of less than 30cc/hr for 12 hours - develop significant redness, pain at incision(s) or wound opens up/separates - any other acute events, problems, or concerns  Wound Care/Dressings/Drain Instructions:  - To take care of your incision/cuts on neck and chest - Apply bacitracin ointment to the incision only on neck and chest twice per day for 5 days. Then switch to vaseline. - It is ok to shower in 48 hours, but avoid rubbing the incision or submerging under water like in a bath or swimming pool - Your stitches are absorbable and will dissolve on their own  - In a few days, do gentle neck rolls (5 rolls twice per day) to prevent any tethering of the neck  Medications: - Resume your regular home medications except as detailed in the medication reconciliation.  - For pain, take tylenol  1000mg  every 6 hours and ibuprofen 400mg  every 6 hours. If that is not sufficient, a stronger pain medication has been  prescribed to you (oxycodone 5mg  tablet every 4-6 hours). Do not mix with any other narcotic medication such as the tramadol you were prescribed recently.  Follow Up:  - A follow up appointment should be scheduled for you after discharge. However, If you do not hear about your appoinment in 3 business days, please call the provided surgery clinic number and confirm/schedule your appointment.    Activity/Restrictions:  - Resume your regular activities, as tolerated. Avoid heavy lifting or straining (more than 5 lbs) for 10 days.  Diet: - Resume your regular diet, as tolerated  Additional Instructions: - Please take an over the counter stool softener while taking narcotic pain medication - DO NOT MIX NARCOTIC PAIN MEDICATIONS OR TAKE NARCOTIC PRESCRIPTIONS AT THE SAME TIME - DO NOT DRIVE OR OPERATE HEAVY MACHINERY WHILE ON NARCOTICS  - DO NOT TAKE MORE THAN 4 GRAMS (4000mg ) OF TYLENOL  (ACETAMINOPHEN ) IN 24 HOURS  Department of Otolaryngology Contact Info: Otolaryngology Front Desk Phone including questions about appointments or questions: [phone] - If after normal business hours (Monday-Friday after 5PM or Weekends/Holidays), please call same number and follow prompts for Patient Access Line. There is a physician on call for urgent matters. For life threatening emergencies, please call 911   Post Anesthesia Home Care Instructions  Activity: Get plenty of rest for the remainder of the day. A responsible individual must stay with you for 24 hours following the procedure.  For the next 24 hours, DO NOT: -  Drive a car -Advertising copywriter -Drink alcoholic beverages -Take any medication unless instructed by your physician -Make any legal decisions or sign important papers.  Meals: Start with liquid foods such as gelatin or soup. Progress to regular foods as tolerated. Avoid greasy, spicy, heavy foods. If nausea and/or vomiting occur, drink only clear liquids until the nausea and/or  vomiting subsides. Call your physician if vomiting continues.  Special Instructions/Symptoms: Your throat may feel dry or sore from the anesthesia or the breathing tube placed in your throat during surgery. If this causes discomfort, gargle with warm salt water. The discomfort should disappear within 24 hours.  If you had a scopolamine patch placed behind your ear for the management of post- operative nausea and/or vomiting:  1. The medication in the patch is effective for 72 hours, after which it should be removed.  Wrap patch in a tissue and discard in the trash. Wash hands thoroughly with soap and water. 2. You may remove the patch earlier than 72 hours if you experience unpleasant side effects which may include dry mouth, dizziness or visual disturbances. 3. Avoid touching the patch. Wash your hands with soap and water after contact with the patch.    *May have Tylenol  today at 1:15pm

## 2023-12-28 ENCOUNTER — Encounter (HOSPITAL_BASED_OUTPATIENT_CLINIC_OR_DEPARTMENT_OTHER): Payer: Self-pay | Admitting: Otolaryngology

## 2024-01-11 ENCOUNTER — Ambulatory Visit (INDEPENDENT_AMBULATORY_CARE_PROVIDER_SITE_OTHER): Admitting: Otolaryngology

## 2024-01-11 ENCOUNTER — Encounter (INDEPENDENT_AMBULATORY_CARE_PROVIDER_SITE_OTHER): Payer: Self-pay | Admitting: Otolaryngology

## 2024-01-11 VITALS — BP 201/90 | HR 65 | Ht 68.0 in | Wt 200.0 lb

## 2024-01-11 DIAGNOSIS — G4733 Obstructive sleep apnea (adult) (pediatric): Secondary | ICD-10-CM

## 2024-01-11 DIAGNOSIS — Z9889 Other specified postprocedural states: Secondary | ICD-10-CM

## 2024-01-11 DIAGNOSIS — Z789 Other specified health status: Secondary | ICD-10-CM

## 2024-01-11 NOTE — Progress Notes (Signed)
 S/p Hypoglossal nerve stimulator placement on 12/27/2023 S: Doing well, pain resolved, no fevers or issue with wound. No tongue weakness, no lip weakness. No SOB O: Tongue movement intact; no lower lip weakness; neck and chest incision c/d/I, no evidence of infection A/P: 78 y.o. y.o. w/ OSA s/p Hypoglossal nerve stimulator placement on 12/27/2023 Doing well, no evidence of infection Recommend continued gentle neck rolls F/u as needed  Laiya Wisby B Colby Catanese

## 2024-01-25 ENCOUNTER — Ambulatory Visit: Admitting: Sleep Medicine

## 2024-01-25 ENCOUNTER — Encounter: Payer: Self-pay | Admitting: Sleep Medicine

## 2024-01-25 VITALS — BP 116/70 | HR 63 | Temp 98.8°F | Ht 68.0 in | Wt 205.5 lb

## 2024-01-25 DIAGNOSIS — I10 Essential (primary) hypertension: Secondary | ICD-10-CM

## 2024-01-25 DIAGNOSIS — G4733 Obstructive sleep apnea (adult) (pediatric): Secondary | ICD-10-CM | POA: Diagnosis not present

## 2024-01-25 NOTE — Patient Instructions (Signed)
 Use Inspire therapy every night. Follow up in 4 weeks.

## 2024-01-25 NOTE — Progress Notes (Signed)
 Name:Nicholas Arellano MRN: [id number] DOB: 10/04/1945   CHIEF COMPLAINT:  INSPIRE ACTIVATION   HISTORY OF PRESENT ILLNESS:  Nicholas Arellano is a 78 y.o. w/ a h/o OSA and obesity who presents for Inspire activation. Reports that Nicholas Arellano was activated by Dr. Tobie on 12/27/23. Most recent sleep study was January 2024 which revealed moderate OSA (AHI 26). Denies any pain or complaints at this time.   Reports that he was initially diagnosed with OSA around 5 years ago and was subsequently started on CPAP therapy which he used for 1-2 years. States that he discontinued use due to discomfort.   Reports c/o loud snoring. Reports nocturnal awakenings due to nocturia, however does not have difficulty falling back to sleep. Denies any significant weight changes. Denies morning headaches, RLS symptoms, dream enactment, cataplexy, hypnagogic or hypnapompic hallucinations. Reports a family history of sleep apnea. Denies drowsy driving. Drinks 1 cup of coffee daily, occasional alcohol use, denies tobacco or illicit drug use.   Bedtime 10:30-11:30 pm Sleep onset 10 mins Rise time 8  am   PAST MEDICAL HISTORY :   has a past medical history of Arthritis (2017), Chronic kidney disease, Gout, History of kidney stones, Hypertension, and Sleep apnea.  has a past surgical history that includes Tonsillectomy; Vasectomy; Colonoscopy; Drug induced endoscopy (N/A, 10/11/2023); and Implantation of hypoglossal nerve stimulator (Right, 12/27/2023). Prior to Admission medications   Medication Sig Start Date End Date Taking? Authorizing Provider  BENFOTIAMINE PO Take 300 mg by mouth daily.   Yes [provider]  donepezil (ARICEPT) 5 MG tablet Take 5 mg by mouth at bedtime.   Yes [provider]  HYDROcodone-acetaminophen  (NORCO/VICODIN) 5-325 MG tablet 1/2 tab q 8 hrs prn pain 01/24/24  Yes [provider]  Magnesium Glycinate 100 MG CAPS Take 100 mg by mouth daily.   Yes [provider]   olmesartan (BENICAR) 20 MG tablet Take 20 mg by mouth at bedtime.   Yes [provider]  fluticasone  (FLONASE ) 50 MCG/ACT nasal spray Place 2 sprays into both nostrils daily. 01/16/15 12/09/15  Lacinda Elsie SQUIBB, PA-C   Allergies  Allergen Reactions   Pork-Derived Products Other (See Comments)    Flares gout   Shellfish Allergy Other (See Comments)    Flares gout    FAMILY HISTORY:  family history includes ADD / ADHD in his son; Cancer in his mother; Hypertension in his father; Obesity in his mother; Stroke in his father and mother. SOCIAL HISTORY:  reports that he has never smoked. He has never used smokeless tobacco. He reports current alcohol use. He reports that he does not use drugs.   Review of Systems:  Gen:  Denies  fever, sweats, chills weight loss  HEENT: Denies blurred vision, double vision, ear pain, eye pain, hearing loss, nose bleeds, sore throat Cardiac:  No dizziness, chest pain or heaviness, chest tightness,edema, No JVD Resp:   No cough, -sputum production, -shortness of breath,-wheezing, -hemoptysis,  Gi: Denies swallowing difficulty, stomach pain, nausea or vomiting, diarrhea, constipation, bowel incontinence Gu:  Denies bladder incontinence, burning urine Ext:   Denies Joint pain, stiffness or swelling Skin: Denies  skin rash, easy bruising or bleeding or hives Endoc:  Denies polyuria, polydipsia , polyphagia or weight change Psych:   Denies depression, insomnia or hallucinations  Other:  All other systems negative  VITAL SIGNS: BP 116/70 (BP Location: Right Arm, Patient Position: Sitting, Cuff Size: Large)   Pulse 63   Temp 98.8 F (  37.1 C) (Oral)   Ht 5' 8 (1.727 m)   Wt 205 lb 8 oz (93.2 kg)   SpO2 96%   BMI 31.25 kg/m    Physical Examination:   General Appearance: No distress  EYES PERRLA, EOM intact.   NECK Supple, No JVD Pulmonary: normal breath sounds, No wheezing.  CardiovascularNormal S1,S2.  No m/r/g.   Abdomen: Benign, Soft,  non-tender. Skin:   warm, no rashes, no ecchymosis  Extremities: normal, no cyanosis, clubbing. Neuro:without focal findings,  speech normal  PSYCHIATRIC: Mood, affect within normal limits.   ASSESSMENT AND PLAN  OSA Inspire device activated in office. Tongue protrusion observed. Sensation threshold 0.6 V, functional threshold 0.7 V, amplitude range 0.7-1.7 V, start delay 30 mins, pause time 15 mins, therapy duration 8 hours. Discussed the consequences of untreated sleep apnea. Advised not to drive drowsy for safety of patient and others. Will follow up in 4 weeks.     HTN Stable, on current management. Following with PCP.    Patient  satisfied with Plan of action and management. All questions answered  I spent a total of 52 minutes reviewing chart data, face-to-face evaluation with the patient, counseling and coordination of care as detailed above.    Nicholas Arellano, M.D.  Sleep Medicine [facility]

## 2024-02-22 ENCOUNTER — Encounter: Payer: Self-pay | Admitting: Sleep Medicine

## 2024-02-22 ENCOUNTER — Ambulatory Visit: Admitting: Sleep Medicine

## 2024-02-22 VITALS — BP 130/80 | HR 52 | Temp 97.7°F | Ht 68.0 in | Wt 201.0 lb

## 2024-02-22 DIAGNOSIS — Z9682 Presence of neurostimulator: Secondary | ICD-10-CM | POA: Diagnosis not present

## 2024-02-22 DIAGNOSIS — I10 Essential (primary) hypertension: Secondary | ICD-10-CM

## 2024-02-22 DIAGNOSIS — G4733 Obstructive sleep apnea (adult) (pediatric): Secondary | ICD-10-CM | POA: Diagnosis not present

## 2024-02-22 NOTE — Progress Notes (Signed)
 Name:Nicholas Arellano MRN: [id number] DOB: 11/19/1945   CHIEF COMPLAINT:  Inspire F/U   HISTORY OF PRESENT ILLNESS:  Mr. is a 78 y.o. w/ a h/o OSA and HTN who presents for Inspire F/U visit. Reports using Inspire therapy every night, which is confirmed by compliance data. He is currently on level 3 (0.9 V). Reports that snoring has improved and feels more refreshed upon awakening with Inspire therapy. Patient is averaging 8.5 hours of sleep per night.    PAST MEDICAL HISTORY :   has a past medical history of Arthritis (2017), Chronic kidney disease, Gout, History of kidney stones, Hypertension (1975), and Sleep apnea.  has a past surgical history that includes Tonsillectomy; Vasectomy; Colonoscopy; Drug induced endoscopy (N/A, 10/11/2023); and Implantation of hypoglossal nerve stimulator (Right, 12/27/2023). Prior to Admission medications   Medication Sig Start Date End Date Taking? Authorizing Provider  BENFOTIAMINE PO Take 300 mg by mouth daily.   Yes [provider]  donepezil (ARICEPT) 5 MG tablet Take 5 mg by mouth at bedtime.   Yes [provider]  Magnesium Glycinate 100 MG CAPS Take 100 mg by mouth daily.   Yes [provider]  olmesartan (BENICAR) 20 MG tablet Take 20 mg by mouth at bedtime.   Yes [provider]  fluticasone  (FLONASE ) 50 MCG/ACT nasal spray Place 2 sprays into both nostrils daily. 01/16/15 12/09/15  Lacinda Elsie SQUIBB, PA-C   Allergies  Allergen Reactions   Pork-Derived Products Other (See Comments)    Flares gout   Shellfish Allergy Other (See Comments)    Flares gout    FAMILY HISTORY:  family history includes ADD / ADHD in his son; Cancer in his mother; Hypertension in his father; Obesity in his mother; Stroke in his father and mother. SOCIAL HISTORY:  reports that he has never smoked. He has never used smokeless tobacco. He reports current alcohol use. He reports that he does not use drugs.   Review of  Systems:  Gen:  Denies  fever, sweats, chills weight loss  HEENT: Denies blurred vision, double vision, ear pain, eye pain, hearing loss, nose bleeds, sore throat Cardiac:  No dizziness, chest pain or heaviness, chest tightness,edema, No JVD Resp:   No cough, -sputum production, -shortness of breath,-wheezing, -hemoptysis,  Gi: Denies swallowing difficulty, stomach pain, nausea or vomiting, diarrhea, constipation, bowel incontinence Gu:  Denies bladder incontinence, burning urine Ext:   Denies Joint pain, stiffness or swelling Skin: Denies  skin rash, easy bruising or bleeding or hives Endoc:  Denies polyuria, polydipsia , polyphagia or weight change Psych:   Denies depression, insomnia or hallucinations  Other:  All other systems negative  VITAL SIGNS: BP 130/80 (BP Location: Right Arm, Patient Position: Sitting, Cuff Size: Large)   Pulse (!) 52   Temp 97.7 F (36.5 C) (Oral)   Ht 5' 8 (1.727 m)   Wt 201 lb (91.2 kg)   SpO2 98%   BMI 30.56 kg/m    Physical Examination:   General Appearance: No distress  EYES PERRLA, EOM intact.   NECK Supple, No JVD Pulmonary: normal breath sounds, No wheezing.  CardiovascularNormal S1,S2.  No m/r/g.   Abdomen: Benign, Soft, non-tender. Skin:   warm, no rashes, no ecchymosis  Extremities: normal, no cyanosis, clubbing. Neuro:without focal findings,  speech normal  PSYCHIATRIC: Mood, affect within normal limits.   ASSESSMENT AND PLAN  OSA Patient is using and benefiting from [facility] therapy. Device was interrogated in office and tongue motion  checked. Changed amplitude to 1.0 V today. Discussed the consequences of untreated sleep apnea. Advised not to drive drowsy for safety of patient and others. Will follow up in 6 weeks for Inspire readiness check.    HTN Stable, on current management. Following with PCP.    Patient  satisfied with Plan of action and management. All questions answered  I spent a total of 36 minutes reviewing  chart data, face-to-face evaluation with the patient, counseling and coordination of care as detailed above.    Tannor Pyon, M.D.  Sleep Medicine [place] [facility]

## 2024-04-14 ENCOUNTER — Ambulatory Visit: Admitting: Sleep Medicine

## 2024-05-02 ENCOUNTER — Encounter: Payer: Self-pay | Admitting: Sleep Medicine

## 2024-05-02 ENCOUNTER — Ambulatory Visit: Admitting: Sleep Medicine

## 2024-05-02 VITALS — BP 138/82 | HR 54 | Temp 97.6°F | Ht 68.0 in | Wt 203.2 lb

## 2024-05-02 DIAGNOSIS — G4733 Obstructive sleep apnea (adult) (pediatric): Secondary | ICD-10-CM

## 2024-05-02 DIAGNOSIS — I10 Essential (primary) hypertension: Secondary | ICD-10-CM

## 2024-05-02 NOTE — Progress Notes (Signed)
 Name:Nicholas Arellano MRN: [id number] DOB: May 20, 1946   CHIEF COMPLAINT:  Inspire F/U   HISTORY OF PRESENT ILLNESS:  Mr. is a 78 y.o. w/ a h/o OSA and HTN who presents for Inspire F/U visit. Reports using Inspire therapy every night, which is confirmed by compliance data. He is currently on level 11 (1.7). Reports that snoring has improved significantly and feels more refreshed upon awakening with Inspire therapy. Patient is averaging 8.5 hours of sleep per night.    PAST MEDICAL HISTORY :   has a past medical history of Arthritis (2017), Chronic kidney disease, Gout, History of kidney stones, Hypertension (1975), and Sleep apnea.  has a past surgical history that includes Tonsillectomy; Vasectomy; Colonoscopy; Drug induced endoscopy (N/A, 10/11/2023); and Implantation of hypoglossal nerve stimulator (Right, 12/27/2023). Prior to Admission medications   Medication Sig Start Date End Date Taking? Authorizing Provider  BENFOTIAMINE PO Take 300 mg by mouth daily.   Yes [provider]  donepezil (ARICEPT) 5 MG tablet Take 5 mg by mouth at bedtime.   Yes [provider]  Magnesium Glycinate 100 MG CAPS Take 100 mg by mouth daily.   Yes [provider]  olmesartan (BENICAR) 20 MG tablet Take 20 mg by mouth at bedtime.   Yes [provider]  fluticasone  (FLONASE ) 50 MCG/ACT nasal spray Place 2 sprays into both nostrils daily. 01/16/15 12/09/15  Lacinda Elsie SQUIBB, PA-C   Allergies  Allergen Reactions   Porcine (Pork) Protein-Containing Drug Products Other (See Comments)    Flares gout   Shellfish Allergy Other (See Comments)    Flares gout    FAMILY HISTORY:  family history includes ADD / ADHD in his son; Cancer in his mother; Hypertension in his father; Obesity in his mother; Stroke in his father and mother. SOCIAL HISTORY:  reports that he has never smoked. He has never used smokeless tobacco. He reports current alcohol use. He reports that he does  not use drugs.   Review of Systems:  Gen:  Denies  fever, sweats, chills weight loss  HEENT: Denies blurred vision, double vision, ear pain, eye pain, hearing loss, nose bleeds, sore throat Cardiac:  No dizziness, chest pain or heaviness, chest tightness,edema, No JVD Resp:   No cough, -sputum production, -shortness of breath,-wheezing, -hemoptysis,  Gi: Denies swallowing difficulty, stomach pain, nausea or vomiting, diarrhea, constipation, bowel incontinence Gu:  Denies bladder incontinence, burning urine Ext:   Denies Joint pain, stiffness or swelling Skin: Denies  skin rash, easy bruising or bleeding or hives Endoc:  Denies polyuria, polydipsia , polyphagia or weight change Psych:   Denies depression, insomnia or hallucinations  Other:  All other systems negative  VITAL SIGNS: BP 138/82   Pulse (!) 54   Temp 97.6 F (36.4 C)   Ht 5' 8 (1.727 m)   Wt 203 lb 3.2 oz (92.2 kg)   SpO2 96%   BMI 30.90 kg/m    Physical Examination:   General Appearance: No distress  EYES PERRLA, EOM intact.   NECK Supple, No JVD Pulmonary: normal breath sounds, No wheezing.  CardiovascularNormal S1,S2.  No m/r/g.   Abdomen: Benign, Soft, non-tender. Skin:   warm, no rashes, no ecchymosis  Extremities: normal, no cyanosis, clubbing. Neuro:without focal findings,  speech normal  PSYCHIATRIC: Mood, affect within normal limits.   ASSESSMENT AND PLAN  OSA Patient is using and benefiting from [facility] therapy. Device was interrogated in office and tongue motion checked. Changed amplitude to 1.5 V  today. Discussed the consequences of untreated sleep apnea. Advised not to drive drowsy for safety of patient and others. Will complete in lab Inspire titration study and follow up to results.    HTN Stable, on current management. Following with PCP.    Patient  satisfied with Plan of action and management. All questions answered  I spent a total of 23 minutes reviewing chart data, face-to-face  evaluation with the patient, counseling and coordination of care as detailed above.    Kortne All, M.D.  Sleep Medicine [place] [facility]

## 2024-06-27 NOTE — Progress Notes (Unsigned)
 [facility] [address] [place] [address] Phone: [phone] Subjective:    I'm seeing this patient by the request  of:  Valora Lynwood FALCON, MD  CC: Hip and back pain  YEP:Dlagzrupcz  Nicholas Arellano is a 78 y.o. male coming in with complaint of hip and back pain.  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  Moderate to severe facet arthritis also at L5-S1.   Was seen by another provider 1 week ago.  Was told likely more hip than back pain.  Given a prednisone  taper. Past Medical History:  Diagnosis Date   Arthritis 2017   Chronic kidney disease    Gout    History of kidney stones    in college   Hypertension 1975   Under control with medication   Sleep apnea    CPAP   Past Surgical History:  Procedure Laterality Date   COLONOSCOPY     DRUG INDUCED ENDOSCOPY N/A 10/11/2023   Procedure: DRUG INDUCED SLEEP ENDOSCOPY;  Surgeon: Tobie Eldora NOVAK, MD;  Location: [facility] OR;  Service: ENT;  Laterality: N/A;   IMPLANTATION OF HYPOGLOSSAL NERVE STIMULATOR Right 12/27/2023   Procedure: INSERTION, HYPOGLOSSAL NERVE STIMULATOR;  Surgeon: Tobie Eldora NOVAK, MD;  Location: [facility];  Service: ENT;  Laterality: Right;   TONSILLECTOMY     VASECTOMY     Social History   Socioeconomic History   Marital status: Married    Spouse name: Not on file   Number of children: Not on file   Years of education: Not on file   Highest education level: Not on file  Occupational History   Not on file  Tobacco Use   Smoking status: Never   Smokeless tobacco: Never  Substance and Sexual Activity   Alcohol use: Yes    Comment: socially   Drug use: No   Sexual activity: Not on file  Other Topics Concern   Not on file  Social History  Narrative   Not on file   Social Drivers of Health   Financial Resource Strain: Low Risk  (01/12/2024)   Received from [facility]   Overall Financial Resource Strain (CARDIA)    Difficulty of Paying Living Expenses: Not hard at all  Food Insecurity: No Food Insecurity (01/12/2024)   [facility]   Hunger Vital Sign    Within the past 12 months, you worried that your food would run out before you got the money to buy more.: Never true    Within the past 12 months, the food you bought just didn't last and you didn't have money to get more.: Never true  Transportation Needs: No Transportation Needs (01/12/2024)   Received from [facility] - Transportation    In the past 12 months, has lack of transportation kept you from medical appointments or from getting medications?: No    Lack of Transportation (Non-Medical): No  Physical Activity: Not on file  Stress: Not on file  Social Connections: Not on file   Allergies  Allergen Reactions   Porcine (Pork) Protein-Containing Drug Products Other (See Comments)    Flares gout   Shellfish Allergy Other (See Comments)    Flares gout   Family  History  Problem Relation Age of Onset   Stroke Mother    Cancer Mother    Obesity Mother    Hypertension Father    Stroke Father    ADD / ADHD Son      Current Outpatient Medications (Cardiovascular):    olmesartan (BENICAR) 20 MG tablet, Take 20 mg by mouth at bedtime.     Current Outpatient Medications (Other):    BENFOTIAMINE PO, Take 300 mg by mouth daily.   donepezil (ARICEPT) 5 MG tablet, Take 5 mg by mouth at bedtime.   Magnesium Glycinate 100 MG CAPS, Take 100 mg by mouth daily.   Reviewed prior external information including notes and imaging from  primary care provider As well as notes that were available from care everywhere and other healthcare systems.  Past medical history, social, surgical and  family history all reviewed in electronic medical record.  No pertanent information unless stated regarding to the chief complaint.   Review of Systems:  No headache, visual changes, nausea, vomiting, diarrhea, constipation, dizziness, abdominal pain, skin rash, fevers, chills, night sweats, weight loss, swollen lymph nodes, body aches, joint swelling, chest pain, shortness of breath, mood changes. POSITIVE muscle aches  Objective  There were no vitals taken for this visit.   General: No apparent distress alert and oriented x3 mood and affect normal, dressed appropriately.  HEENT: Pupils equal, extraocular movements intact  Respiratory: Patient's speak in full sentences and does not appear short of breath  Cardiovascular: No lower extremity edema, non tender, no erythema  Hip exam shows    Impression and Recommendations:     The above documentation has been reviewed and is accurate and complete Joaquim Tolen M Tayquan Gassman, DO

## 2024-06-28 ENCOUNTER — Ambulatory Visit

## 2024-06-28 ENCOUNTER — Ambulatory Visit: Admitting: Family Medicine

## 2024-06-28 ENCOUNTER — Encounter: Payer: Self-pay | Admitting: Family Medicine

## 2024-06-28 VITALS — BP 132/78 | HR 72 | Ht 68.0 in | Wt 204.0 lb

## 2024-06-28 DIAGNOSIS — G8929 Other chronic pain: Secondary | ICD-10-CM | POA: Diagnosis not present

## 2024-06-28 DIAGNOSIS — M5442 Lumbago with sciatica, left side: Secondary | ICD-10-CM

## 2024-06-28 DIAGNOSIS — M545 Low back pain, unspecified: Secondary | ICD-10-CM

## 2024-06-28 MED ORDER — GABAPENTIN 100 MG PO CAPS: 200.0000 mg | ORAL_CAPSULE | Freq: Every day | ORAL | 0 refills | Status: AC

## 2024-06-28 NOTE — Patient Instructions (Signed)
 Gabapentin  200mg  at night Xray  Exercises Avoid repetitive flexion See me again in 6 weeks  Write us  with update in 3 weeks

## 2024-06-28 NOTE — Assessment & Plan Note (Signed)
 Chronic problem with now an exacerbation.  Had workup 4 years ago.  Will get x-rays again.  Has been given a steroid pack previously.  Started on gabapentin , home exercises given.  We discussed worsening symptoms would have to go towards advanced imaging.  Have done physical therapy previously.  Follow-up again in 6 to 8 weeks

## 2024-07-05 ENCOUNTER — Ambulatory Visit: Payer: Self-pay | Admitting: Family Medicine

## 2024-07-26 NOTE — Progress Notes (Signed)
 "               Nicholas Arellano [facility] [address] [place] [address] Phone: [phone]   Assessment and Plan:     1. Chronic bilateral low back pain with left-sided sciatica (Primary) 2. Anterolisthesis of lumbar spine -Chronic with exacerbation, subsequent visit - Consistent with lumbar anterolisthesis causing low back pain and left-sided lumbar radiculopathy - Recommend further evaluation with lumbar MRI based on no improvement despite conservative therapy, radicular symptoms progressing, pain with day-to-day activities, pain >6/10.  Patient has had benefit with epidural CSI in the past which could be considered based on lumbar MRI results - Start meloxicam  15 mg daily x2 weeks.   Do not to use additional over-the-counter NSAIDs (ibuprofen , naproxen , Advil , Aleve , etc.) while taking prescription NSAIDs.  May use Tylenol  [phone] mg 2 to 3 times a day for breakthrough pain.  Do not recommend continuing NSAIDs after 2-week course due to history of CKD - Continue HEP - Continue gabapentin  200 mg    Pertinent previous records reviewed include lumbar x-ray   Follow Up: 1 week after MRI to review results and discuss treatment plan.  Could consider epidural based on results   Subjective:   I, Nicholas Arellano, am serving as a neurosurgeon for Doctor Morene Mace  Chief Complaint: left side low body pain   HPI:   07/27/2024 Patient is a 79 year old male with left side low back pain. Patient states pain started a couple of weeks ago. Left sided low back down the hip and leg. Pain started as an ache but now is a burning sensation. No MOI. Asprin for the pain and that helps a little, enough for him to sleep. Lidocaine  patch didn't do much for the hip. Decreased ROM. Notes tingling in the calf no numbness. Yesterday right side started to ache but that seemed to resolve    Relevant Historical Information:  CKD, hypertension  Additional pertinent review of  systems negative.  Current Medications[1]   Objective:     Vitals:   07/27/24 1120  BP: 130/72  Pulse: 65  SpO2: 99%  Weight: 205 lb (93 kg)  Height: 5' 8 (1.727 m)      Body mass index is 31.17 kg/m.    Physical Exam:    Gen: Appears well, nad, nontoxic and pleasant Psych: Alert and oriented, appropriate mood and affect Neuro: sensation intact, strength is 5/5 in upper and lower extremities, muscle tone wnl Skin: no susupicious lesions or rashes  Back - Normal skin, Spine with normal alignment and no deformity.   No tenderness to vertebral process palpation.   Paraspinous muscles are not tender and without spasm NTTP gluteal musculature Straight leg raise positive left, negative right Trendelenberg negative Piriformis Test negative Gait normal    Electronically signed by:  Nicholas Arellano [facility] 12:07 PM 07/27/2024     [1]  Current Outpatient Medications:    meloxicam  (MOBIC ) 15 MG tablet, Take 1 tablet (15 mg total) by mouth daily., Disp: 14 tablet, Rfl: 0   BENFOTIAMINE PO, Take 300 mg by mouth daily., Disp: , Rfl:    donepezil (ARICEPT) 5 MG tablet, Take 5 mg by mouth at bedtime., Disp: , Rfl:    gabapentin  (NEURONTIN ) 100 MG capsule, Take 2 capsules (200 mg total) by mouth at bedtime., Disp: 180 capsule, Rfl: 0   Magnesium Glycinate 100 MG CAPS, Take 100 mg by mouth daily., Disp: , Rfl:  olmesartan (BENICAR) 20 MG tablet, Take 20 mg by mouth at bedtime., Disp: , Rfl:   "

## 2024-07-27 ENCOUNTER — Ambulatory Visit: Admitting: Sports Medicine

## 2024-07-27 VITALS — BP 130/72 | HR 65 | Ht 68.0 in | Wt 205.0 lb

## 2024-07-27 DIAGNOSIS — M4316 Spondylolisthesis, lumbar region: Secondary | ICD-10-CM | POA: Diagnosis not present

## 2024-07-27 DIAGNOSIS — M5442 Lumbago with sciatica, left side: Secondary | ICD-10-CM | POA: Diagnosis not present

## 2024-07-27 DIAGNOSIS — G8929 Other chronic pain: Secondary | ICD-10-CM

## 2024-07-27 MED ORDER — MELOXICAM 15 MG PO TABS: 15.0000 mg | ORAL_TABLET | Freq: Every day | ORAL | 0 refills | Status: AC

## 2024-07-27 NOTE — Patient Instructions (Signed)
 MRI lumbar   Follow up 1 week after to discuss   - Start meloxicam  15 mg daily x2 weeks.  May use remaining NSAID as needed once daily for pain control.  Do not to use additional over-the-counter NSAIDs (ibuprofen , naproxen , Advil , Aleve , etc.) while taking prescription NSAIDs.  May use Tylenol  [phone] mg 2 to 3 times a day for breakthrough pain.

## 2024-08-08 ENCOUNTER — Ambulatory Visit (HOSPITAL_BASED_OUTPATIENT_CLINIC_OR_DEPARTMENT_OTHER): Attending: Sleep Medicine | Admitting: Sleep Medicine

## 2024-08-08 DIAGNOSIS — G4733 Obstructive sleep apnea (adult) (pediatric): Secondary | ICD-10-CM

## 2024-08-09 ENCOUNTER — Ambulatory Visit: Payer: Self-pay | Admitting: Sports Medicine

## 2024-08-09 ENCOUNTER — Ambulatory Visit
Admission: RE | Admit: 2024-08-09 | Discharge: 2024-08-09 | Disposition: A | Discharge status: Home / Self Care | Source: Ambulatory Visit | Attending: Sports Medicine | Admitting: Sports Medicine

## 2024-08-09 DIAGNOSIS — M4316 Spondylolisthesis, lumbar region: Secondary | ICD-10-CM | POA: Insufficient documentation

## 2024-08-09 DIAGNOSIS — M5442 Lumbago with sciatica, left side: Secondary | ICD-10-CM | POA: Diagnosis present

## 2024-08-09 DIAGNOSIS — G8929 Other chronic pain: Secondary | ICD-10-CM | POA: Insufficient documentation

## 2024-08-10 NOTE — Progress Notes (Unsigned)
 " [facility] [address] [place] [address] Phone: [phone] Subjective:    I'm seeing this patient by the request  of:  Valora Lynwood FALCON, MD  CC:   YEP:Dlagzrupcz  06/28/2024 Chronic problem with now an exacerbation.  Had workup 4 years ago.  Will get x-rays again.  Has been given a steroid pack previously.  Started on gabapentin , home exercises given.  We discussed worsening symptoms would have to go towards advanced imaging.  Have done physical therapy previously.  Follow-up again in 6 to 8 weeks      Update 08/14/2024 Yonathan Perrow is a 79 y.o. male coming in with complaint of lumbar spine pain. Saw Dr. Leonce for back pain in January. Patient states     MRI lumbar 08/14/2024 IMPRESSION: Multilevel disc pathology, listhesis, and facet arthropathy within the lumbar spine as described.  Past Medical History:  Diagnosis Date   Arthritis 2017   Chronic kidney disease    Gout    History of kidney stones    in college   Hypertension 1975   Under control with medication   Sleep apnea    CPAP   Past Surgical History:  Procedure Laterality Date   COLONOSCOPY     DRUG INDUCED ENDOSCOPY N/A 10/11/2023   Procedure: DRUG INDUCED SLEEP ENDOSCOPY;  Surgeon: Tobie Eldora NOVAK, MD;  Location: [facility] OR;  Service: ENT;  Laterality: N/A;   IMPLANTATION OF HYPOGLOSSAL NERVE STIMULATOR Right 12/27/2023   Procedure: INSERTION, HYPOGLOSSAL NERVE STIMULATOR;  Surgeon: Tobie Eldora NOVAK, MD;  Location: [facility];  Service: ENT;  Laterality: Right;   TONSILLECTOMY     VASECTOMY     Social History   Socioeconomic History   Marital status: Married    Spouse name: Not on file   Number of children: Not on file   Years of education: Not on file   Highest education level: Not on file  Occupational History   Not on file  Tobacco Use   Smoking status: Never   Smokeless tobacco: Never  Substance and Sexual Activity   Alcohol use: Yes     Comment: socially   Drug use: No   Sexual activity: Not on file  Other Topics Concern   Not on file  Social History Narrative   Not on file   Social Drivers of Health   Tobacco Use: Low Risk (06/28/2024)   Patient History    Smoking Tobacco Use: Never    Smokeless Tobacco Use: Never    Passive Exposure: Not on file  Financial Resource Strain: Low Risk  (01/12/2024)   Received from [facility]   Overall Financial Resource Strain (CARDIA)    Difficulty of Paying Living Expenses: Not hard at all  Food Insecurity: No Food Insecurity (01/12/2024)   Received from [facility]   Epic    Within the past 12 months, you worried that your food would run out before you got the money to buy more.: Never true    Within the past 12 months, the food you bought just didn't last and you didn't have money to get more.: Never true  Transportation Needs: No Transportation Needs (01/12/2024)   Received from [facility] - Transportation    In the past 12 months, has lack of transportation kept you from medical appointments or from getting medications?: No    Lack of Transportation (Non-Medical): No  Physical Activity: Not on  file  Stress: Not on file  Social Connections: Not on file  Depression (EYV7-0): Not on file  Alcohol Screen: Not on file  Housing: Low Risk  (01/12/2024)   Received from [facility]   Epic    In the last 12 months, was there a time when you were not able to pay the mortgage or rent on time?: No    In the past 12 months, how many times have you moved where you were living?: 0    At any time in the past 12 months, were you homeless or living in a shelter (including now)?: No  Utilities: Not At Risk (01/12/2024)   Received from [facility]   Epic    In the past 12 months has the electric, gas, oil, or water company threatened to shut off services in your home?: No  Health Literacy:  Not on file   Allergies[1] Family History  Problem Relation Age of Onset   Stroke Mother    Cancer Mother    Obesity Mother    Hypertension Father    Stroke Father    ADD / ADHD Son     Current Outpatient Medications (Cardiovascular):    olmesartan (BENICAR) 20 MG tablet, Take 20 mg by mouth at bedtime.  Current Outpatient Medications (Analgesics):    meloxicam  (MOBIC ) 15 MG tablet, Take 1 tablet (15 mg total) by mouth daily.  Current Outpatient Medications (Other):    BENFOTIAMINE PO, Take 300 mg by mouth daily.   donepezil (ARICEPT) 5 MG tablet, Take 5 mg by mouth at bedtime.   gabapentin  (NEURONTIN ) 100 MG capsule, Take 2 capsules (200 mg total) by mouth at bedtime.   Magnesium Glycinate 100 MG CAPS, Take 100 mg by mouth daily.   Reviewed prior external information including notes and imaging from  primary care provider As well as notes that were available from care everywhere and other healthcare systems.  Past medical history, social, surgical and family history all reviewed in electronic medical record.  No pertanent information unless stated regarding to the chief complaint.   Review of Systems:  No headache, visual changes, nausea, vomiting, diarrhea, constipation, dizziness, abdominal pain, skin rash, fevers, chills, night sweats, weight loss, swollen lymph nodes, body aches, joint swelling, chest pain, shortness of breath, mood changes. POSITIVE muscle aches  Objective  There were no vitals taken for this visit.   General: No apparent distress alert and oriented x3 mood and affect normal, dressed appropriately.  HEENT: Pupils equal, extraocular movements intact  Respiratory: Patient's speak in full sentences and does not appear short of breath  Cardiovascular: No lower extremity edema, non tender, no erythema      Impression and Recommendations:           [1]  Allergies Allergen Reactions   Porcine (Pork) Protein-Containing Drug Products Other (See  Comments)    Flares gout   Shellfish Allergy Other (See Comments)    Flares gout   "

## 2024-08-14 ENCOUNTER — Ambulatory Visit: Admitting: Family Medicine

## 2024-08-28 ENCOUNTER — Ambulatory Visit: Admitting: Sleep Medicine
